# Patient Record
Sex: Female | Born: 1955 | Race: Black or African American | Hispanic: No | Marital: Married | Smoking: Never smoker
Health system: Southern US, Community
[De-identification: ages and names within clinical notes are randomized; demographics above are authoritative.]

## PROBLEM LIST (undated history)

## (undated) DIAGNOSIS — M1990 Unspecified osteoarthritis, unspecified site: Secondary | ICD-10-CM

## (undated) DIAGNOSIS — I10 Essential (primary) hypertension: Secondary | ICD-10-CM

## (undated) HISTORY — PX: COLONOSCOPY: SHX174

## (undated) HISTORY — DX: Unspecified osteoarthritis, unspecified site: M19.90

---

## 2003-10-03 HISTORY — PX: OTHER SURGICAL HISTORY: SHX169

## 2017-05-08 ENCOUNTER — Emergency Department (HOSPITAL_COMMUNITY)
Admission: EM | Admit: 2017-05-08 | Discharge: 2017-05-08 | Disposition: A | Discharge status: Home / Self Care | Payer: BLUE CROSS/BLUE SHIELD | Attending: Emergency Medicine | Admitting: Emergency Medicine

## 2017-05-08 ENCOUNTER — Encounter (HOSPITAL_COMMUNITY): Payer: Self-pay

## 2017-05-08 ENCOUNTER — Emergency Department (HOSPITAL_COMMUNITY): Payer: BLUE CROSS/BLUE SHIELD

## 2017-05-08 DIAGNOSIS — I10 Essential (primary) hypertension: Secondary | ICD-10-CM | POA: Insufficient documentation

## 2017-05-08 DIAGNOSIS — R200 Anesthesia of skin: Secondary | ICD-10-CM | POA: Diagnosis not present

## 2017-05-08 DIAGNOSIS — R072 Precordial pain: Secondary | ICD-10-CM | POA: Diagnosis not present

## 2017-05-08 DIAGNOSIS — M79601 Pain in right arm: Secondary | ICD-10-CM | POA: Insufficient documentation

## 2017-05-08 DIAGNOSIS — R0602 Shortness of breath: Secondary | ICD-10-CM | POA: Diagnosis present

## 2017-05-08 HISTORY — DX: Essential (primary) hypertension: I10

## 2017-05-08 LAB — CBC
HEMATOCRIT: 42.6 % (ref 36.0–46.0)
Hemoglobin: 14.7 g/dL (ref 12.0–15.0)
MCH: 29 pg (ref 26.0–34.0)
MCHC: 34.5 g/dL (ref 30.0–36.0)
MCV: 84 fL (ref 78.0–100.0)
PLATELETS: 250 10*3/uL (ref 150–400)
RBC: 5.07 MIL/uL (ref 3.87–5.11)
RDW: 12.6 % (ref 11.5–15.5)
WBC: 7.7 10*3/uL (ref 4.0–10.5)

## 2017-05-08 LAB — BASIC METABOLIC PANEL
Anion gap: 10 (ref 5–15)
BUN: 13 mg/dL (ref 6–20)
CHLORIDE: 103 mmol/L (ref 101–111)
CO2: 25 mmol/L (ref 22–32)
CREATININE: 0.71 mg/dL (ref 0.44–1.00)
Calcium: 9.7 mg/dL (ref 8.9–10.3)
Glucose, Bld: 130 mg/dL — ABNORMAL HIGH (ref 65–99)
POTASSIUM: 3.6 mmol/L (ref 3.5–5.1)
SODIUM: 138 mmol/L (ref 135–145)

## 2017-05-08 LAB — I-STAT TROPONIN, ED: Troponin i, poc: 0 ng/mL (ref 0.00–0.08)

## 2017-05-08 MED ORDER — ASPIRIN 81 MG PO CHEW
324.0000 mg | CHEWABLE_TABLET | Freq: Once | ORAL | Status: AC
  Administered 2017-05-08: 324 mg via ORAL
  Filled 2017-05-08: qty 4

## 2017-05-08 NOTE — Discharge Instructions (Signed)

## 2017-05-08 NOTE — ED Provider Notes (Signed)
Emergency Department Provider Note   I have reviewed the triage vital signs and the nursing notes.   HISTORY  Chief Complaint Shortness of Breath/arm numbness   HPI Deborah [name] is a 61 y.o. female with past medical history of hypertension presents to the emergency department for evaluation of shortness of breath and right arm and chest pain. Symptoms have been ongoing for several months and worsening over the last month. She describes worsening symptoms with exertion. Sometimes she'll have sudden numbness sensation in the right arm if she is using the arm or if the arm is hanging at the side of her body. No fevers or chills. No history of heart attack or stroke. She also notices having shortness of breath with even minor exertion. She does not smoke cigarettes. She is new to the area and has not established care with a primary care physician.    Past Medical History:  Diagnosis Date  . Hypertension     There are no active problems to display for this patient.   History reviewed. No pertinent surgical history.    Allergies Patient has no known allergies.  No family history on file.  Social History Social History  Substance Use Topics  . Smoking status: Never Smoker  . Smokeless tobacco: Never Used  . Alcohol use No    Review of Systems  Constitutional: No fever/chills Eyes: No visual changes. ENT: No sore throat. Cardiovascular: Positive chest pain. Respiratory: Positive shortness of breath. Gastrointestinal: No abdominal pain.  No nausea, no vomiting.  No diarrhea.  No constipation. Genitourinary: Negative for dysuria. Musculoskeletal: Negative for back pain. Skin: Negative for rash. Neurological: Negative for headaches, focal weakness or numbness.  10-point ROS otherwise negative.  ____________________________________________   PHYSICAL EXAM:  VITAL SIGNS: ED Triage Vitals [05/08/17 1021]  Enc Vitals Group     BP (!) 153/98     Pulse Rate  100     Resp 18     Temp 98.2 F (36.8 C)     Temp Source Oral     SpO2 96 %   Constitutional: Alert and oriented. Well appearing and in no acute distress. Eyes: Conjunctivae are normal.  Head: Atraumatic. Nose: No congestion/rhinnorhea. Mouth/Throat: Mucous membranes are moist.  Neck: No stridor.  Cardiovascular: Normal rate, regular rhythm. Good peripheral circulation. Grossly normal heart sounds.   Respiratory: Normal respiratory effort.  No retractions. Lungs CTAB. Gastrointestinal: Soft and nontender. No distention.  Musculoskeletal: No lower extremity tenderness nor edema. No gross deformities of extremities. Normal ROM of the right shoulder.  Neurologic:  Normal speech and language. No gross focal neurologic deficits are appreciated.  Skin:  Skin is warm, dry and intact. No rash noted.  ____________________________________________   LABS (all labs ordered are listed, but only abnormal results are displayed)  Labs Reviewed  BASIC METABOLIC PANEL - Abnormal; Notable for the following:       Result Value   Glucose, Bld 130 (*)    All other components within normal limits  CBC  I-STAT TROPONIN, ED   ____________________________________________  EKG   EKG Interpretation  Date/Time:  Tuesday May 08 2017 10:21:06 EDT Ventricular Rate:  96 PR Interval:  136 QRS Duration: 66 QT Interval:  314 QTC Calculation: 396 R Axis:   1 Text Interpretation:  Normal sinus rhythm Right atrial enlargement Cannot rule out Anterior infarct , age undetermined Abnormal ECG No STEMI. No old tracing for comparison.  Confirmed by Nanda Quinton [phone]) on 05/08/2017 11:05:10 AM  ____________________________________________  RADIOLOGY  Dg Chest 2 View  Result Date: 05/08/2017 CLINICAL DATA:  Patient complains of 1 month of increasing exertional shortness of breath and right arm numbness. Also complains of right sided chest and axilla pain. EXAM: CHEST  2 VIEW COMPARISON:  None.  FINDINGS: Lateral view degraded by patient arm position. Numerous leads and wires project over the chest. Mild right hemidiaphragm elevation. Midline trachea. Normal heart size and mediastinal contours. Atherosclerosis in the transverse aorta. No pleural effusion or pneumothorax. Clear lungs. IMPRESSION: No acute cardiopulmonary disease. Aortic Atherosclerosis (ICD10-I70.0). Electronically Signed   By: Abigail Miyamoto M.D.   On: 05/08/2017 11:11   Ct Head Wo Contrast  Result Date: 05/08/2017 CLINICAL DATA:  Right arm numbness. EXAM: CT HEAD WITHOUT CONTRAST TECHNIQUE: Contiguous axial images were obtained from the base of the skull through the vertex without intravenous contrast. COMPARISON:  None. FINDINGS: Brain: No evidence of acute infarction, hemorrhage, hydrocephalus, extra-axial collection or mass lesion/mass effect. Vascular: No hyperdense vessel or unexpected calcification. Skull: Normal. Negative for fracture or focal lesion. Sinuses/Orbits: No acute finding. Other: Bony prominence is seen involving the left parietal skull. IMPRESSION: No acute intracranial abnormality seen. Electronically Signed   By: Marijo Conception, M.D.   On: 05/08/2017 12:04    ____________________________________________   PROCEDURES  Procedure(s) performed:   Procedures  None ____________________________________________   INITIAL IMPRESSION / ASSESSMENT AND PLAN / ED COURSE  Pertinent labs & imaging results that were available during my care of the patient were reviewed by me and considered in my medical decision making (see chart for details).  Patient presents to the emergency department for prolonged chest pain, SOB with exertion, and right arm discomfort/numbness. Low suspicion for CVA but with prolonged symptoms will obtain CT head. No active CP or SOB on my evaluation. Symptoms progressing over months. Plan for troponin and CXR.   Labs and imaging unremarkable. With several months of symptoms recommended  PCP follow up with Cardiology and Neurology follow up. No evidence of critical cervical spine stenosis on exam. Suspect peripheral nerve compression. With CP would recommend PCP evaluation and consideration of outpatient stress test.   At this time, I do not feel there is any life-threatening condition present. I have reviewed and discussed all results (EKG, imaging, lab, urine as appropriate), exam findings with patient. I have reviewed nursing notes and appropriate previous records.  I feel the patient is safe to be discharged home without further emergent workup. Discussed usual and customary return precautions. Patient and family (if present) verbalize understanding and are comfortable with this plan.  Patient will follow-up with their primary care provider. If they do not have a primary care provider, information for follow-up has been provided to them. All questions have been answered.  ____________________________________________  FINAL CLINICAL IMPRESSION(S) / ED DIAGNOSES  Final diagnoses:  Right arm pain  Right arm numbness  Precordial chest pain     MEDICATIONS GIVEN DURING THIS VISIT:  Medications  aspirin chewable tablet 324 mg (324 mg Oral Given 05/08/17 1142)     NEW OUTPATIENT MEDICATIONS STARTED DURING THIS VISIT:  There are no discharge medications for this patient.     Note:  This document was prepared using Dragon voice recognition software and may include unintentional dictation errors.  Nanda Quinton, MD Emergency Medicine    Long, [facility], MD 05/08/17 Dorthula Perfect

## 2017-05-08 NOTE — ED Triage Notes (Signed)
Patient complains of 1 month of increasing exertional shortness of breath and right arm numbness. Also complains of right sided chest and axilla pain. Alert and oriented on arrival

## 2017-05-08 NOTE — ED Notes (Signed)
Pt to radiology.

## 2017-05-08 NOTE — ED Notes (Signed)
Pt ambulated to room from waiting room, tolerated well. 

## 2017-05-08 NOTE — ED Notes (Signed)
Patient given discharge instructions and verbalized understanding.  Patient stable to discharge at this time.  Patient is alert and oriented to baseline.  No distressed noted at this time.  All belongings taken with the patient at discharge.   

## 2017-05-10 ENCOUNTER — Encounter: Payer: Self-pay | Admitting: Neurology

## 2017-05-10 ENCOUNTER — Ambulatory Visit (INDEPENDENT_AMBULATORY_CARE_PROVIDER_SITE_OTHER): Payer: BLUE CROSS/BLUE SHIELD | Admitting: Neurology

## 2017-05-10 VITALS — BP 146/93 | HR 98 | Ht 62.0 in | Wt 149.5 lb

## 2017-05-10 DIAGNOSIS — M79601 Pain in right arm: Secondary | ICD-10-CM

## 2017-05-10 DIAGNOSIS — M5412 Radiculopathy, cervical region: Secondary | ICD-10-CM | POA: Diagnosis not present

## 2017-05-10 DIAGNOSIS — R29898 Other symptoms and signs involving the musculoskeletal system: Secondary | ICD-10-CM | POA: Diagnosis not present

## 2017-05-10 DIAGNOSIS — R202 Paresthesia of skin: Secondary | ICD-10-CM

## 2017-05-10 DIAGNOSIS — R200 Anesthesia of skin: Secondary | ICD-10-CM

## 2017-05-10 MED ORDER — METHYLPREDNISOLONE 4 MG PO TBPK
ORAL_TABLET | ORAL | 0 refills | Status: DC
Start: 2017-05-10 — End: 2019-08-27

## 2017-05-10 MED ORDER — ETODOLAC 400 MG PO TABS: 400.0000 mg | ORAL_TABLET | Freq: Two times a day (BID) | ORAL | 5 refills | Status: DC

## 2017-05-10 MED ORDER — GABAPENTIN 300 MG PO CAPS: 300.0000 mg | ORAL_CAPSULE | Freq: Three times a day (TID) | ORAL | 11 refills | Status: DC

## 2017-05-10 NOTE — Patient Instructions (Signed)
I called him 3 prescriptions for you. The methylprednisolone is a steroid that you will take over 6 days.  Take etodolac 400 mg twice a day. This is usually stronger than the ibuprofen.  Take gabapentin 300 mg 3 times a day.

## 2017-05-10 NOTE — Progress Notes (Signed)
[facility]  PATIENT: Deborah [name] DOB: 1956-03-04  REFERRING DOCTOR OR PCP:  ED  SOURCE: patient, ED notes, lab and imaging results, CT scan images on PACS.  _________________________________   HISTORICAL  CHIEF COMPLAINT:  Chief Complaint  Patient presents with  . New Patient (Initial Visit)    Referral from the ED of rigiht arm numbness,    HISTORY OF PRESENT ILLNESS:  Deborah [name] is a 61 year old woman with hypertension who presented to the emergency room on 05/08/2017 with shortness of breath and right arm numbness and pain and chest pain.  She has had pain and numbness in the right arm for 6 months that worsened over the past week and she went to the ED 2 days ago.    She has not had much neck pain.     She notes that the pain will radiate from the shoulder into the hand, involving the third and fourth fingers the most.   Generally, pain is worse when she is standing with the arm by her side or when she lays down with the arm by her side. Having the arm over her chest will usually decrease the pain.   She also notes that there is some weakness in the arm. When the arm gets numb and painful with the weakness seems to be worse.    She does not note any weakness in the legs. There is no leg numbness. Bladder function is unchanged.  In the Emergency room, she had a CT scan of the head. I personally reviewed the images and it is normal. She also had a chest x-ray was read as normal except for aortic atherosclerosis.   She was given aspirin and had lab work. An EKG was performed that showed normal rate, rhythm, and intervals.  Anterior infarct could not be ruled out but there was no evidence of a STEMI.  She was discharged with cardiology in neurology follow-up.  REVIEW OF SYSTEMS: Constitutional: No fevers, chills, sweats, or change in appetite Eyes: No visual changes, double vision, eye pain Ear, nose and throat: No hearing loss, ear pain, nasal  congestion, sore throat Cardiovascular: No chest pain, palpitations Respiratory: No shortness of breath at rest or with exertion.   No wheezes GastrointestinaI: No nausea, vomiting, diarrhea, abdominal pain, fecal incontinence Genitourinary: No dysuria, urinary retention or frequency.  No nocturia. Musculoskeletal: No neck pain, back pain Integumentary: No rash, pruritus, skin lesions Neurological: as above Psychiatric: No depression at this time.  No anxiety Endocrine: No palpitations, diaphoresis, change in appetite, change in weigh or increased thirst Hematologic/Lymphatic: No anemia, purpura, petechiae. Allergic/Immunologic: No itchy/runny eyes, nasal congestion, recent allergic reactions, rashes  ALLERGIES: No Known Allergies  HOME MEDICATIONS:  Current Outpatient Prescriptions:  .  ASPIRIN 81 PO, Take by mouth., Disp: , Rfl:  .  Ibuprofen (ADVIL PO), Take by mouth., Disp: , Rfl:  .  valsartan (DIOVAN) 40 MG tablet, Take 40 mg by mouth daily., Disp: , Rfl:  .  valsartan-hydrochlorothiazide (DIOVAN-HCT) 320-25 MG tablet, Take 1 tablet by mouth daily., Disp: , Rfl:  .  etodolac (LODINE) 400 MG tablet, Take 1 tablet (400 mg total) by mouth 2 (two) times daily., Disp: 60 tablet, Rfl: 5 .  gabapentin (NEURONTIN) 300 MG capsule, Take 1 capsule (300 mg total) by mouth 3 (three) times daily., Disp: 90 capsule, Rfl: 11 .  methylPREDNISolone (MEDROL DOSEPAK) 4 MG TBPK tablet, 6 po x 1 d, then 5 po x 1d, then 4 po x 1d,  then 3 po x 1d, then 2 po x 1d, then 1 po, Disp: 21 tablet, Rfl: 0  PAST MEDICAL HISTORY: Past Medical History:  Diagnosis Date  . Hypertension     PAST SURGICAL HISTORY: History reviewed. No pertinent surgical history.  FAMILY HISTORY: History reviewed. No pertinent family history.  SOCIAL HISTORY:  Social History   Social History  . Marital status: Married    Spouse name: N/A  . Number of children: N/A  . Years of education: N/A   Occupational History    . Not on file.   Social History Main Topics  . Smoking status: Never Smoker  . Smokeless tobacco: Never Used  . Alcohol use No  . Drug use: No  . Sexual activity: Not on file   Other Topics Concern  . Not on file   Social History Narrative  . No narrative on file     PHYSICAL EXAM  Vitals:   05/10/17 1422  BP: (!) 146/93  Pulse: 98  Weight: 149 lb 8 oz (67.8 kg)  Height: 5\' 2"  (1.575 m)    Body mass index is 27.34 kg/m.   General: The patient is well-developed and well-nourished and in no acute distress  Eyes:  Funduscopic exam shows normal optic discs and retinal vessels.  Neck: The neck is supple, no carotid bruits are noted.  The neck is nontender.  Cardiovascular: The heart has a regular rate and rhythm with a normal S1 and S2. There were no murmurs, gallops or rubs. Lungs are clear to auscultation.  Skin: Extremities are without significant edema.  Musculoskeletal:  Back is nontender  Neurologic Exam  Mental status: The patient is alert and oriented x 3 at the time of the examination. The patient has apparent normal recent and remote memory, with an apparently normal attention span and concentration ability.   Speech is normal.  Cranial nerves: Extraocular movements are full. Pupils are equal, round, and reactive to light and accomodation.  Visual fields are full.  Facial symmetry is present. There is good facial sensation to soft touch bilaterally.Facial strength is normal.  Trapezius and sternocleidomastoid strength is normal. No dysarthria is noted.  The tongue is midline, and the patient has symmetric elevation of the soft palate. No obvious hearing deficits are noted.  Motor:  Muscle bulk is normal.   Tone is normal. Strength is 4/5 in the triceps, pronators and finger extensors..   Sensory: Sensory testing is intact to pinprick, soft touch and vibration sensation in all 4 extremities.  Coordination: Cerebellar testing reveals good finger-nose-finger  and heel-to-shin bilaterally.  Gait and station: Station is normal.   Gait is normal. Tandem gait is normal. Romberg is negative.   Reflexes: Deep tendon reflexes are symmetric in the biceps but slightly reduced in the right triceps versus left triceps. Leg DTRs are symmetric and normal bilaterally.   Plantar responses are flexor.    DIAGNOSTIC DATA (LABS, IMAGING, TESTING) - I reviewed patient records, labs, notes, testing and imaging myself where available.  Lab Results  Component Value Date   WBC 7.7 05/08/2017   HGB 14.7 05/08/2017   HCT 42.6 05/08/2017   MCV 84.0 05/08/2017   PLT 250 05/08/2017      Component Value Date/Time   NA 138 05/08/2017 1023   K 3.6 05/08/2017 1023   CL 103 05/08/2017 1023   CO2 25 05/08/2017 1023   GLUCOSE 130 (H) 05/08/2017 1023   BUN 13 05/08/2017 1023   CREATININE 0.71 05/08/2017 1023  CALCIUM 9.7 05/08/2017 1023   GFRNONAA >60 05/08/2017 1023   GFRAA >60 05/08/2017 1023         ASSESSMENT AND PLAN  Cervical radiculopathy at C7 - Plan: MR CERVICAL SPINE WO CONTRAST  Right arm numbness  Right arm pain  Right arm weakness   In summary, Mrs. Inda Merlin is a 61 year old woman with right arm pain and numbness who has weakness on examination and mild reflex asymmetry most consistent with a right C7 radiculopathy. Symptoms have not improved with time or conservative treatment. Additionally, because of the weakness, we need to get an MRI of the cervical spine to determine if there is a herniated disc or other finding explaining the radiculopathy. Depending on the results and whether or not she receives a benefit from med's we will prescribe, she might be a candidate for an epidural steroid injection or referral to neurosurgery.   I will go ahead and have her take a steroid pack and then start etodolac and gabapentin to see if that gives her any more benefit.  She will return to see me in 6-8 weeks or sooner if there are new or worsening  neurologic symptoms.   Richard A. Felecia Shelling, MD, [facility] 5/0/0370, 4:88 PM Certified in Neurology, Clinical Neurophysiology, Sleep Medicine, Pain Medicine and Neuroimaging  [facility] [address], [place], [place] [address] [phone]

## 2017-05-14 ENCOUNTER — Telehealth: Payer: Self-pay | Admitting: Neurology

## 2017-05-14 ENCOUNTER — Encounter: Payer: Self-pay | Admitting: *Deleted

## 2017-05-14 NOTE — Telephone Encounter (Signed)
I have spoken with pt. this am.  She sts. she works as a Scientist, water quality at [employer].  Right arm pain is improved since starting Etodolac, oral steroids, Gabapentin. She would like to continue working, but with restrictions.  Letter printed and up front for her to pick up/fim

## 2017-05-14 NOTE — Telephone Encounter (Signed)
Patient called office in reference to her visit with Dr. Felecia Shelling on 05/10/17.  Patient would like to know if she is able to get a work note to be out of work for 2 weeks due to medications and not being sure what is wrong with her right now.  Please call

## 2017-05-24 ENCOUNTER — Ambulatory Visit
Admission: RE | Admit: 2017-05-24 | Discharge: 2017-05-24 | Disposition: A | Discharge status: Home / Self Care | Payer: BLUE CROSS/BLUE SHIELD | Source: Ambulatory Visit | Attending: Neurology | Admitting: Neurology

## 2017-05-24 DIAGNOSIS — M5412 Radiculopathy, cervical region: Secondary | ICD-10-CM

## 2017-05-29 ENCOUNTER — Telehealth: Payer: Self-pay | Admitting: *Deleted

## 2017-05-29 DIAGNOSIS — R29898 Other symptoms and signs involving the musculoskeletal system: Secondary | ICD-10-CM

## 2017-05-29 DIAGNOSIS — R200 Anesthesia of skin: Secondary | ICD-10-CM

## 2017-05-29 DIAGNOSIS — M79601 Pain in right arm: Secondary | ICD-10-CM

## 2017-05-29 DIAGNOSIS — M5412 Radiculopathy, cervical region: Secondary | ICD-10-CM

## 2017-05-29 NOTE — Telephone Encounter (Signed)
-----   Message from Britt Bottom, MD sent at 05/28/2017  4:25 PM EDT ----- Please note that the MRI of the cervical spine does show degenerative changes at C4-C5, C5-C6 and C6-C7. 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     If she is not any better we can refer for a cervical epidural steroid injection

## 2017-05-29 NOTE — Telephone Encounter (Signed)
I have spoken with Deborah [name] and her husband this afternoon, reviewed MRI results as below, explained esi and offered to her for continued pain.  She verbalized understanding of same, is agreeable with esi. Order in EPIC/fim

## 2017-05-30 NOTE — Telephone Encounter (Signed)
Order was sent  For epidural steroid Injection .

## 2017-06-11 ENCOUNTER — Ambulatory Visit: Payer: Self-pay | Admitting: Internal Medicine

## 2017-06-18 ENCOUNTER — Ambulatory Visit
Admission: RE | Admit: 2017-06-18 | Discharge: 2017-06-18 | Disposition: A | Discharge status: Home / Self Care | Payer: BLUE CROSS/BLUE SHIELD | Source: Ambulatory Visit | Attending: Neurology | Admitting: Neurology

## 2017-06-18 DIAGNOSIS — R29898 Other symptoms and signs involving the musculoskeletal system: Secondary | ICD-10-CM

## 2017-06-18 DIAGNOSIS — R200 Anesthesia of skin: Secondary | ICD-10-CM

## 2017-06-18 DIAGNOSIS — M79601 Pain in right arm: Secondary | ICD-10-CM

## 2017-06-18 DIAGNOSIS — M5412 Radiculopathy, cervical region: Secondary | ICD-10-CM

## 2017-06-18 MED ORDER — TRIAMCINOLONE ACETONIDE 40 MG/ML IJ SUSP (RADIOLOGY)
60.0000 mg | Freq: Once | INTRAMUSCULAR | Status: AC
  Administered 2017-06-18: 60 mg via EPIDURAL

## 2017-06-18 MED ORDER — IOPAMIDOL (ISOVUE-M 300) INJECTION 61%
1.0000 mL | Freq: Once | INTRAMUSCULAR | Status: AC | PRN
  Administered 2017-06-18: 1 mL via EPIDURAL

## 2017-06-18 NOTE — Discharge Instructions (Signed)

## 2017-06-26 ENCOUNTER — Other Ambulatory Visit: Payer: Self-pay | Admitting: Neurology

## 2017-06-26 ENCOUNTER — Encounter: Payer: Self-pay | Admitting: Neurology

## 2017-06-26 ENCOUNTER — Ambulatory Visit (INDEPENDENT_AMBULATORY_CARE_PROVIDER_SITE_OTHER): Payer: BLUE CROSS/BLUE SHIELD | Admitting: Neurology

## 2017-06-26 VITALS — BP 138/94 | HR 75 | Resp 16 | Ht 62.0 in | Wt 157.5 lb

## 2017-06-26 DIAGNOSIS — M4802 Spinal stenosis, cervical region: Secondary | ICD-10-CM

## 2017-06-26 DIAGNOSIS — M5412 Radiculopathy, cervical region: Secondary | ICD-10-CM

## 2017-06-26 DIAGNOSIS — M79601 Pain in right arm: Secondary | ICD-10-CM

## 2017-06-26 DIAGNOSIS — R29898 Other symptoms and signs involving the musculoskeletal system: Secondary | ICD-10-CM | POA: Diagnosis not present

## 2017-06-26 MED ORDER — VALSARTAN 40 MG PO TABS: 40.0000 mg | ORAL_TABLET | Freq: Every day | ORAL | 1 refills | Status: DC

## 2017-06-26 NOTE — Progress Notes (Signed)
[facility]  PATIENT: Deborah [name] DOB: Oct 26, 1955  REFERRING DOCTOR OR PCP:  ED  SOURCE: patient, ED notes, lab and imaging results, CT scan images on PACS.  _________________________________   HISTORICAL  CHIEF COMPLAINT:  Chief Complaint  Patient presents with  . Neck Pain    Sts. neck pain is completely resolved since esi./fim    HISTORY OF PRESENT ILLNESS:  Deborah [name] is a 61 year old woman with a right cervical radiculopathy.   Update 06/26/2017:   Initially, as she had hypertension, when she went to the emergency room she was evaluated for a cardiac event. She was referred to me for the neck and arm pain.   I reviewed the MRI personally in the presence of Brinkley and her husband.     The MRI shows borderline retrolisthesis at C4-C5 social with reduced disc height, disc protrusion and uncovertebral spurring causing moderate spinal stenosis and mild bilateral foraminal narrowing. At C5-C6 there is disc protrusion and uncovertebral spurring causing mild spinal stenosis and mild bilateral foraminal narrowing at C6-C7 there is a small left paramedian disc protrusion but no nerve root compression. She was started on gabapentin and the gabapentin helped her rest better at night but incompletely helped her pain. She was referred for an epidural steroid injection. She underwent this on 06/18/2017 (to the right at C7T1).    Since then, she feels her pain is much better. She now does not experience any pain in the right arm or the neck. She is sleeping even better at night.    She denies any other neurologic symptoms. Specifically there is no numbness or weakness. She does not note any bladder difficulty.    At the initial visit, there was mild weakness in the right triceps and normal sensory exam.  ______________________________________________ From 05/10/2017  She has had pain and numbness in the right arm for 6 months that worsened over the past week and  she went to the ED 2 days ago.    She has not had much neck pain.     She notes that the pain will radiate from the shoulder into the hand, involving the third and fourth fingers the most.   Generally, pain is worse when she is standing with the arm by her side or when she lays down with the arm by her side. Having the arm over her chest will usually decrease the pain.   She also notes that there is some weakness in the arm. When the arm gets numb and painful with the weakness seems to be worse.    She does not note any weakness in the legs. There is no leg numbness. Bladder function is unchanged.  In the Emergency room, she had a CT scan of the head. I personally reviewed the images and it is normal. She also had a chest x-ray was read as normal except for aortic atherosclerosis.   She was given aspirin and had lab work. An EKG was performed that showed normal rate, rhythm, and intervals.  Anterior infarct could not be ruled out but there was no evidence of a STEMI.  She was discharged with cardiology in neurology follow-up.  REVIEW OF SYSTEMS: Constitutional: No fevers, chills, sweats, or change in appetite Eyes: No visual changes, double vision, eye pain Ear, nose and throat: No hearing loss, ear pain, nasal congestion, sore throat Cardiovascular: No chest pain, palpitations Respiratory: No shortness of breath at rest or with exertion.   No wheezes GastrointestinaI: No nausea, vomiting,  diarrhea, abdominal pain, fecal incontinence Genitourinary: No dysuria, urinary retention or frequency.  No nocturia. Musculoskeletal: No neck pain, back pain Integumentary: No rash, pruritus, skin lesions Neurological: as above Psychiatric: No depression at this time.  No anxiety Endocrine: No palpitations, diaphoresis, change in appetite, change in weigh or increased thirst Hematologic/Lymphatic: No anemia, purpura, petechiae. Allergic/Immunologic: No itchy/runny eyes, nasal congestion, recent allergic  reactions, rashes  ALLERGIES: No Known Allergies  HOME MEDICATIONS:  Current Outpatient Prescriptions:  .  ASPIRIN 81 PO, Take by mouth., Disp: , Rfl:  .  etodolac (LODINE) 400 MG tablet, Take 1 tablet (400 mg total) by mouth 2 (two) times daily., Disp: 60 tablet, Rfl: 5 .  gabapentin (NEURONTIN) 300 MG capsule, Take 1 capsule (300 mg total) by mouth 3 (three) times daily., Disp: 90 capsule, Rfl: 11 .  Ibuprofen (ADVIL PO), Take by mouth., Disp: , Rfl:  .  methylPREDNISolone (MEDROL DOSEPAK) 4 MG TBPK tablet, 6 po x 1 d, then 5 po x 1d, then 4 po x 1d, then 3 po x 1d, then 2 po x 1d, then 1 po, Disp: 21 tablet, Rfl: 0 .  valsartan (DIOVAN) 40 MG tablet, Take 1 tablet (40 mg total) by mouth daily., Disp: 30 tablet, Rfl: 1 .  valsartan-hydrochlorothiazide (DIOVAN-HCT) 320-25 MG tablet, Take 1 tablet by mouth daily., Disp: , Rfl:   PAST MEDICAL HISTORY: Past Medical History:  Diagnosis Date  . Hypertension     PAST SURGICAL HISTORY: No past surgical history on file.  FAMILY HISTORY: No family history on file.  SOCIAL HISTORY:  Social History   Social History  . Marital status: Married    Spouse name: N/A  . Number of children: N/A  . Years of education: N/A   Occupational History  . Not on file.   Social History Main Topics  . Smoking status: Never Smoker  . Smokeless tobacco: Never Used  . Alcohol use No  . Drug use: No  . Sexual activity: Not on file   Other Topics Concern  . Not on file   Social History Narrative  . No narrative on file     PHYSICAL EXAM  Vitals:   06/26/17 1527  BP: (!) 138/94  Pulse: 75  Resp: 16  Weight: 157 lb 8 oz (71.4 kg)  Height: 5\' 2"  (1.575 m)    Body mass index is 28.81 kg/m.   General: The patient is well-developed and well-nourished and in no acute distress   Neck: The neck is supple, no carotid bruits are noted.  The neck is nontender.   Neurologic Exam  Mental status: The patient is alert and oriented x 3 at  the time of the examination. The patient has apparent normal recent and remote memory, with an apparently normal attention span and concentration ability.   Speech is normal.  Cranial nerves: Extraocular movements are full. Facial strength and sensation is normal.   No obvious hearing deficits are noted.  Motor:  Muscle bulk is normal.   Tone is normal. Strength is 4/5 in the triceps and 4+/5 in a pronators and finger extensors of the right arm and 5/5 elsewhere.  Sensory: She has intact sensation to touch, temperature and vibration in the arms.   Gait and station: Station is normal.   Gait is normal.    Reflexes: Deep tendon reflexes are normal and symmetric at the biceps. DTRs are 1+ at the right triceps and 2+ at the left triceps. DTRs are normal in the legs  DIAGNOSTIC DATA (LABS, IMAGING, TESTING) - I reviewed patient records, labs, notes, testing and imaging myself where available.  Lab Results  Component Value Date   WBC 7.7 05/08/2017   HGB 14.7 05/08/2017   HCT 42.6 05/08/2017   MCV 84.0 05/08/2017   PLT 250 05/08/2017      Component Value Date/Time   NA 138 05/08/2017 1023   K 3.6 05/08/2017 1023   CL 103 05/08/2017 1023   CO2 25 05/08/2017 1023   GLUCOSE 130 (H) 05/08/2017 1023   BUN 13 05/08/2017 1023   CREATININE 0.71 05/08/2017 1023   CALCIUM 9.7 05/08/2017 1023   GFRNONAA >60 05/08/2017 1023   GFRAA >60 05/08/2017 1023         ASSESSMENT AND PLAN  Right arm weakness  Right arm pain  Cervical radiculopathy  Cervical spinal stenosis   1.    She will continue gabapentin. We discussed that if she continues to do well she can try to taper off of the gabapentin. If she finds that she did better on the medication she could always go back on. Additionally, if her sleep is better because of the gabapentin she can keep the bedtime doses even if she stops the other doses. 2.    We discussed that if her pain returns in a similar characteristic as the  initial pain, we can refer her back for another epidural steroid injection.     3.    They have recently moved to this area and she has been on able to get in with her primary care yet. I will go ahead and write her blood pressure medicine for 30 days with one refill to give her time to establish care with a primary care provider.  4.    She will return as needed if there are new or worsening neurologic symptoms.   Euna Armon A. Felecia Shelling, MD, [facility] [address] 7/82/9562, 1:30 PM Certified in Neurology, Clinical Neurophysiology, Sleep Medicine, Pain Medicine and Neuroimaging  [facility] [address] [place], [place] [address] [phone]

## 2017-06-27 ENCOUNTER — Telehealth: Payer: Self-pay | Admitting: Neurology

## 2017-06-27 NOTE — Telephone Encounter (Signed)
Patient is calling to discuss dosage of valsartan-hydrochlorothiazide (DIOVAN-HCT) 320-25 MG tablet. She also needs a letter stating she is not allowed to lift heavy items at her job.Marland Kitchen Please call patient when ready for pick up for letter.

## 2017-06-27 NOTE — Telephone Encounter (Signed)
Spoke with pt. who sts. that, after checking her med bottle at home, realized she takes Valsartan/HCTZ 320/25mg . New rx. escribed to [facility], and I called [facility] and explained they should disregard Valsartan rx. escribed yesterday. Also explained to pt. she may return to work without restrictions since she is completely pain free.  Should still stop any activity that reproduces pain. She verbalized understanding of same./fim

## 2017-06-27 NOTE — Addendum Note (Signed)
Addended by: France Ravens I on: 06/27/2017 03:17 PM   Modules accepted: Orders

## 2017-07-27 ENCOUNTER — Ambulatory Visit (INDEPENDENT_AMBULATORY_CARE_PROVIDER_SITE_OTHER): Payer: BLUE CROSS/BLUE SHIELD | Admitting: Internal Medicine

## 2017-07-27 ENCOUNTER — Encounter: Payer: Self-pay | Admitting: Internal Medicine

## 2017-07-27 DIAGNOSIS — I10 Essential (primary) hypertension: Secondary | ICD-10-CM

## 2017-07-27 DIAGNOSIS — Z23 Encounter for immunization: Secondary | ICD-10-CM

## 2017-07-27 MED ORDER — VALSARTAN-HYDROCHLOROTHIAZIDE 320-25 MG PO TABS: 1.0000 | ORAL_TABLET | Freq: Every day | ORAL | 3 refills | Status: DC

## 2017-07-27 NOTE — Patient Instructions (Addendum)
I have filled your prescription. You will need to follow up for health maintenance in 1 week.   Back Exercises If you have pain in your back, do these exercises 2-3 times each day or as told by your doctor. When the pain goes away, do the exercises once each day, but repeat the steps more times for each exercise (do more repetitions). If you do not have pain in your back, do these exercises once each day or as told by your doctor. Exercises Single Knee to Chest  Do these steps 3-5 times in a row for each leg: 1. Lie on your back on a firm bed or the floor with your legs stretched out. 2. Bring one knee to your chest. 3. Hold your knee to your chest by grabbing your knee or thigh. 4. Pull on your knee until you feel a gentle stretch in your lower back. 5. Keep doing the stretch for 10-30 seconds. 6. Slowly let go of your leg and straighten it.  Pelvic Tilt  Do these steps 5-10 times in a row: 1. Lie on your back on a firm bed or the floor with your legs stretched out. 2. Bend your knees so they point up to the ceiling. Your feet should be flat on the floor. 3. Tighten your lower belly (abdomen) muscles to press your lower back against the floor. This will make your tailbone point up to the ceiling instead of pointing down to your feet or the floor. 4. Stay in this position for 5-10 seconds while you gently tighten your muscles and breathe evenly.  Cat-Cow  Do these steps until your lower back bends more easily: 1. Get on your hands and knees on a firm surface. Keep your hands under your shoulders, and keep your knees under your hips. You may put padding under your knees. 2. Let your head hang down, and make your tailbone point down to the floor so your lower back is round like the back of a cat. 3. Stay in this position for 5 seconds. 4. Slowly lift your head and make your tailbone point up to the ceiling so your back hangs low (sags) like the back of a cow. 5. Stay in this position for  5 seconds.  Press-Ups  Do these steps 5-10 times in a row: 1. Lie on your belly (face-down) on the floor. 2. Place your hands near your head, about shoulder-width apart. 3. While you keep your back relaxed and keep your hips on the floor, slowly straighten your arms to raise the top half of your body and lift your shoulders. Do not use your back muscles. To make yourself more comfortable, you may change where you place your hands. 4. Stay in this position for 5 seconds. 5. Slowly return to lying flat on the floor.  Bridges  Do these steps 10 times in a row: 1. Lie on your back on a firm surface. 2. Bend your knees so they point up to the ceiling. Your feet should be flat on the floor. 3. Tighten your butt muscles and lift your butt off of the floor until your waist is almost as high as your knees. If you do not feel the muscles working in your butt and the back of your thighs, slide your feet 1-2 inches farther away from your butt. 4. Stay in this position for 3-5 seconds. 5. Slowly lower your butt to the floor, and let your butt muscles relax.  If this exercise is too easy,  try doing it with your arms crossed over your chest. Belly Crunches  Do these steps 5-10 times in a row: 1. Lie on your back on a firm bed or the floor with your legs stretched out. 2. Bend your knees so they point up to the ceiling. Your feet should be flat on the floor. 3. Cross your arms over your chest. 4. Tip your chin a little bit toward your chest but do not bend your neck. 5. Tighten your belly muscles and slowly raise your chest just enough to lift your shoulder blades a tiny bit off of the floor. 6. Slowly lower your chest and your head to the floor.  Back Lifts Do these steps 5-10 times in a row: 1. Lie on your belly (face-down) with your arms at your sides, and rest your forehead on the floor. 2. Tighten the muscles in your legs and your butt. 3. Slowly lift your chest off of the floor while you  keep your hips on the floor. Keep the back of your head in line with the curve in your back. Look at the floor while you do this. 4. Stay in this position for 3-5 seconds. 5. Slowly lower your chest and your face to the floor.  Contact a doctor if:  Your back pain gets a lot worse when you do an exercise.  Your back pain does not lessen 2 hours after you exercise. If you have any of these problems, stop doing the exercises. Do not do them again unless your doctor says it is okay. Get help right away if:  You have sudden, very bad back pain. If this happens, stop doing the exercises. Do not do them again unless your doctor says it is okay. This information is not intended to replace advice given to you by your health care provider. Make sure you discuss any questions you have with your health care provider. Document Released: 10/21/2010 Document Revised: 02/24/2016 Document Reviewed: 11/12/2014 Elsevier Interactive Patient Education  [employer].

## 2017-07-27 NOTE — Progress Notes (Signed)
   [name] Kerrin Mo, MD Phone: [phone]  Reason For Visit: Establish Care   # CHRONIC HTN Current Meds - Diovan, HCTZ   Reports good compliance, took meds today. Tolerating well, w/o complaints. Has been having elevated blood pressures  Lifestyle - Difficulty with exercise due to back and neck pain  Denies CP, dyspnea, HA, edema, dizziness / lightheadedness  Past Medical History Reviewed problem list.  Medications- reviewed and updated No additions to family history Social history- patient is a  Non- smoker  Objective: BP (!) 138/96   Pulse 77   Temp 98.6 F (37 C) (Oral)   Ht 5\' 2"  (1.575 m)   Wt 157 lb (71.2 kg)   SpO2 99%   BMI 28.72 kg/m  Gen: NAD, alert, cooperative with exam HEENT: Normal    Neck: No masses palpated. No lymphadenopathy    Ears: Tympanic membranes intact, normal light reflex, no erythema, no bulging    Eyes: PERRLA, EOMI    Nose: nasal turbinates moist    Throat: moist mucus membranes, no erythema Cardio: regular rate and rhythm, S1S2 heard, no murmurs appreciated Pulm: clear to auscultation bilaterally, no wheezes, rhonchi or rales GI: soft, non-tender, non-distended, bowel sounds present, no hepatomegaly, no splenomegaly Extremities: warm, well perfused, No edema, cyanosis or clubbing;  MSK: Normal gait and station Skin: dry, intact, no rashes or lesions Neuro: Strength and sensation grossly intact  Patient reports no  vision/ hearing changes,anorexia, weight change, fever ,adenopathy, persistant / recurrent hoarseness, swallowing issues, chest pain, edema,persistant / recurrent cough, hemoptysis, dyspnea(rest, exertional, paroxysmal nocturnal), gastrointestinal  bleeding (melena, rectal bleeding), abdominal pain, excessive heart burn, GU symptoms(dysuria, hematuria, pyuria, voiding/incontinence  Issues) syncope, focal weakness, severe memory loss, concerning skin lesions, depression, anxiety, abnormal  bruising/bleeding, major joint swelling, breast masses or abnormal vaginal bleeding.    Assessment/Plan: See problem based a/p  Essential hypertension Blood pressure controlled - Follow up in two weeks  - valsartan-hydrochlorothiazide (DIOVAN-HCT) 320-25 MG tablet; Take 1 tablet by mouth daily.  Dispense: 30 tablet; Refill: 3 - CBC - Basic metabolic panel - Lipid panel

## 2017-07-28 LAB — BASIC METABOLIC PANEL
BUN / CREAT RATIO: 20 (ref 12–28)
BUN: 16 mg/dL (ref 8–27)
CO2: 26 mmol/L (ref 20–29)
CREATININE: 0.81 mg/dL (ref 0.57–1.00)
Calcium: 9.4 mg/dL (ref 8.7–10.3)
Chloride: 100 mmol/L (ref 96–106)
GFR calc Af Amer: 90 mL/min/{1.73_m2} (ref >59)
GFR calc non Af Amer: 78 mL/min/{1.73_m2} (ref >59)
GLUCOSE: 101 mg/dL — AB (ref 65–99)
POTASSIUM: 4.2 mmol/L (ref 3.5–5.2)
Sodium: 139 mmol/L (ref 134–144)

## 2017-07-28 LAB — CBC
HEMOGLOBIN: 14.1 g/dL (ref 11.1–15.9)
Hematocrit: 42.2 % (ref 34.0–46.6)
MCH: 28.9 pg (ref 26.6–33.0)
MCHC: 33.4 g/dL (ref 31.5–35.7)
MCV: 87 fL (ref 79–97)
PLATELETS: 289 10*3/uL (ref 150–379)
RBC: 4.88 x10E6/uL (ref 3.77–5.28)
RDW: 13.9 % (ref 12.3–15.4)
WBC: 8 10*3/uL (ref 3.4–10.8)

## 2017-07-28 LAB — LIPID PANEL
CHOLESTEROL TOTAL: 222 mg/dL — AB (ref 100–199)
Chol/HDL Ratio: 5.6 ratio — ABNORMAL HIGH (ref 0.0–4.4)
HDL: 40 mg/dL (ref >39)
LDL CALC: 138 mg/dL — AB (ref 0–99)
TRIGLYCERIDES: 222 mg/dL — AB (ref 0–149)
VLDL Cholesterol Cal: 44 mg/dL — ABNORMAL HIGH (ref 5–40)

## 2017-07-31 NOTE — Assessment & Plan Note (Signed)
Blood pressure controlled - Follow up in two weeks  - valsartan-hydrochlorothiazide (DIOVAN-HCT) 320-25 MG tablet; Take 1 tablet by mouth daily.  Dispense: 30 tablet; Refill: 3 - CBC - Basic metabolic panel - Lipid panel

## 2017-08-01 ENCOUNTER — Encounter: Payer: Self-pay | Admitting: Internal Medicine

## 2017-08-17 ENCOUNTER — Ambulatory Visit (INDEPENDENT_AMBULATORY_CARE_PROVIDER_SITE_OTHER): Payer: BLUE CROSS/BLUE SHIELD | Admitting: Internal Medicine

## 2017-08-17 ENCOUNTER — Other Ambulatory Visit: Payer: Self-pay

## 2017-08-17 ENCOUNTER — Encounter: Payer: Self-pay | Admitting: Internal Medicine

## 2017-08-17 DIAGNOSIS — M5416 Radiculopathy, lumbar region: Secondary | ICD-10-CM

## 2017-08-17 DIAGNOSIS — Z0000 Encounter for general adult medical examination without abnormal findings: Secondary | ICD-10-CM | POA: Diagnosis not present

## 2017-08-17 DIAGNOSIS — Z1231 Encounter for screening mammogram for malignant neoplasm of breast: Secondary | ICD-10-CM

## 2017-08-17 DIAGNOSIS — E781 Pure hyperglyceridemia: Secondary | ICD-10-CM

## 2017-08-17 DIAGNOSIS — Z1239 Encounter for other screening for malignant neoplasm of breast: Secondary | ICD-10-CM

## 2017-08-17 MED ORDER — GABAPENTIN 300 MG PO CAPS: 300.0000 mg | ORAL_CAPSULE | Freq: Three times a day (TID) | ORAL | 11 refills | Status: DC

## 2017-08-17 MED ORDER — PREDNISONE 20 MG PO TABS: 20.0000 mg | ORAL_TABLET | Freq: Every day | ORAL | 0 refills | Status: DC

## 2017-08-17 NOTE — Progress Notes (Signed)
   [name] Deborah Mo, MD Phone: [phone]  Reason For Visit: Follow-up  #Health maintenance -Has had a colonoscopy about 10 years ago -Has not had a mammogram recently in the past couple of years -Patient needs a Pap smear has not had one recently as well. she is not sure when she had her last  #HYPERLIPIDEMIA Disease Monitoring: Recently with elevated triglycerides.  Patient has had this in the past.  She would like to try to modify her diet.   #Lumbar radiculopathy -Patient states she is having right lower back pain this is been going on for about 3-4 months. -She previously had cervical radiculopathy and has been seen by neurology for this patient has received steroid shots which helped in the past -Patient denies any specific trauma -She states the pain starts in her buttocks and goes down into her foot, she note numbness associated -She denies any weakness in her lower extremities -Patient denies any bowel or bladder incontinence any fevers, any nighttime awakenings with pain, any weight loss, patient has not been taking steroids chronically, she is not an IV drug user, she is not had a history of cancer, she is not had a history of immunosuppression,  Past Medical History Reviewed problem list.  Medications- reviewed and updated No additions to family history Social history- patient is a non-smoker  Objective: BP 132/82 (BP Location: Left Arm, Patient Position: Sitting)   Pulse (!) 114   Temp 97.9 F (36.6 C) (Oral)   Ht 5\' 2"  (1.575 m)   Wt 156 lb 3.2 oz (70.9 kg)   SpO2 98%   BMI 28.57 kg/m  Gen: NAD, alert, cooperative with exam Cardio: regular rate and rhythm, S1S2 heard, no murmurs appreciated Pulm: clear to auscultation bilaterally, no wheezes, rhonchi or rale MSK: Patient denies any spinal tenderness, she has no abnormalities on inspection, she indicates may be some slight sacroiliac tenderness, she denies any pain or numbness  with straight leg test, 5 out of 5 strength in her lower extremities, 2+ DP pulses, normal gait and station Skin: dry, intact, no rashes or lesions    Assessment/Plan: See problem based a/p  Lumbar radiculopathy Benign examination no red flags, history of cervical radiculopathy likely with lumbar radiculopathy -Discussed follow-up with neurology patient states she has a lot of bills and would like to try to stave that off -Will provide patient with a steroid burst, exercises and gabapentin -Follow-up in 1 month if no improvement  Hypertriglyceridemia Discussed with patient that she may benefit from a statin However patient would like to continue to modify her diet  Healthcare maintenance Provided information on colonoscopy Provided information on mammogram, will place order for mammogram Follow-up for Pap smear

## 2017-08-17 NOTE — Patient Instructions (Addendum)
It was nice seeing you today.  Please follow-up for your colonoscopy and mammogram.  You can make an appointment at my office to specifically get your Pap smear.  Please work on your diet in order to get your triglycerides down some more, we will hold off on starting you on a cholesterol medication.  I am going to prescribe you 5 days of prednisone to help with your radicular pain from your back hopefully this will decrease inflammation. I would continue using the gabapentin as needed    Back Exercises If you have pain in your back, do these exercises 2-3 times each day or as told by your doctor. When the pain goes away, do the exercises once each day, but repeat the steps more times for each exercise (do more repetitions). If you do not have pain in your back, do these exercises once each day or as told by your doctor. Exercises Single Knee to Chest  Do these steps 3-5 times in a row for each leg: 1. Lie on your back on a firm bed or the floor with your legs stretched out. 2. Bring one knee to your chest. 3. Hold your knee to your chest by grabbing your knee or thigh. 4. Pull on your knee until you feel a gentle stretch in your lower back. 5. Keep doing the stretch for 10-30 seconds. 6. Slowly let go of your leg and straighten it.  Pelvic Tilt  Do these steps 5-10 times in a row: 1. Lie on your back on a firm bed or the floor with your legs stretched out. 2. Bend your knees so they point up to the ceiling. Your feet should be flat on the floor. 3. Tighten your lower belly (abdomen) muscles to press your lower back against the floor. This will make your tailbone point up to the ceiling instead of pointing down to your feet or the floor. 4. Stay in this position for 5-10 seconds while you gently tighten your muscles and breathe evenly.  Cat-Cow  Do these steps until your lower back bends more easily: 1. Get on your hands and knees on a firm surface. Keep your hands under your shoulders, and  keep your knees under your hips. You may put padding under your knees. 2. Let your head hang down, and make your tailbone point down to the floor so your lower back is round like the back of a cat. 3. Stay in this position for 5 seconds. 4. Slowly lift your head and make your tailbone point up to the ceiling so your back hangs low (sags) like the back of a cow. 5. Stay in this position for 5 seconds.  Press-Ups  Do these steps 5-10 times in a row: 1. Lie on your belly (face-down) on the floor. 2. Place your hands near your head, about shoulder-width apart. 3. While you keep your back relaxed and keep your hips on the floor, slowly straighten your arms to raise the top half of your body and lift your shoulders. Do not use your back muscles. To make yourself more comfortable, you may change where you place your hands. 4. Stay in this position for 5 seconds. 5. Slowly return to lying flat on the floor.  Bridges  Do these steps 10 times in a row: 1. Lie on your back on a firm surface. 2. Bend your knees so they point up to the ceiling. Your feet should be flat on the floor. 3. Tighten your butt muscles and lift  your butt off of the floor until your waist is almost as high as your knees. If you do not feel the muscles working in your butt and the back of your thighs, slide your feet 1-2 inches farther away from your butt. 4. Stay in this position for 3-5 seconds. 5. Slowly lower your butt to the floor, and let your butt muscles relax.  If this exercise is too easy, try doing it with your arms crossed over your chest. Belly Crunches  Do these steps 5-10 times in a row: 1. Lie on your back on a firm bed or the floor with your legs stretched out. 2. Bend your knees so they point up to the ceiling. Your feet should be flat on the floor. 3. Cross your arms over your chest. 4. Tip your chin a little bit toward your chest but do not bend your neck. 5. Tighten your belly muscles and slowly raise  your chest just enough to lift your shoulder blades a tiny bit off of the floor. 6. Slowly lower your chest and your head to the floor.  Back Lifts Do these steps 5-10 times in a row: 1. Lie on your belly (face-down) with your arms at your sides, and rest your forehead on the floor. 2. Tighten the muscles in your legs and your butt. 3. Slowly lift your chest off of the floor while you keep your hips on the floor. Keep the back of your head in line with the curve in your back. Look at the floor while you do this. 4. Stay in this position for 3-5 seconds. 5. Slowly lower your chest and your face to the floor.  Contact a doctor if:  Your back pain gets a lot worse when you do an exercise.  Your back pain does not lessen 2 hours after you exercise. If you have any of these problems, stop doing the exercises. Do not do them again unless your doctor says it is okay. Get help right away if:  You have sudden, very bad back pain. If this happens, stop doing the exercises. Do not do them again unless your doctor says it is okay. This information is not intended to replace advice given to you by your health care provider. Make sure you discuss any questions you have with your health care provider. Document Released: 10/21/2010 Document Revised: 02/24/2016 Document Reviewed: 11/12/2014 Elsevier Interactive Patient Education  [employer].

## 2017-08-20 DIAGNOSIS — Z0000 Encounter for general adult medical examination without abnormal findings: Secondary | ICD-10-CM | POA: Insufficient documentation

## 2017-08-20 DIAGNOSIS — Z1239 Encounter for other screening for malignant neoplasm of breast: Secondary | ICD-10-CM | POA: Insufficient documentation

## 2017-08-20 DIAGNOSIS — E781 Pure hyperglyceridemia: Secondary | ICD-10-CM | POA: Insufficient documentation

## 2017-08-20 DIAGNOSIS — E785 Hyperlipidemia, unspecified: Secondary | ICD-10-CM | POA: Insufficient documentation

## 2017-08-20 NOTE — Assessment & Plan Note (Signed)
Discussed with patient that she may benefit from a statin However patient would like to continue to modify her diet

## 2017-08-20 NOTE — Assessment & Plan Note (Signed)
Benign examination no red flags, history of cervical radiculopathy likely with lumbar radiculopathy -Discussed follow-up with neurology patient states she has a lot of bills and would like to try to stave that off -Will provide patient with a steroid burst, exercises and gabapentin -Follow-up in 1 month if no improvement

## 2017-08-20 NOTE — Assessment & Plan Note (Signed)
Provided information on colonoscopy Provided information on mammogram, will place order for mammogram Follow-up for Pap smear

## 2017-08-27 ENCOUNTER — Telehealth: Payer: Self-pay | Admitting: Neurology

## 2017-08-27 NOTE — Telephone Encounter (Signed)
Patient says she has had neck pain for the last month. Can another injection be ordered?

## 2017-08-28 NOTE — Telephone Encounter (Signed)
I scheduled patient with Dr. Felecia Shelling on 08-30-17 @ 11am.

## 2017-08-28 NOTE — Telephone Encounter (Signed)
noted/fim 

## 2017-08-30 ENCOUNTER — Other Ambulatory Visit: Payer: Self-pay

## 2017-08-30 ENCOUNTER — Encounter: Payer: Self-pay | Admitting: Neurology

## 2017-08-30 ENCOUNTER — Ambulatory Visit (INDEPENDENT_AMBULATORY_CARE_PROVIDER_SITE_OTHER): Payer: BLUE CROSS/BLUE SHIELD | Admitting: Neurology

## 2017-08-30 VITALS — BP 129/82 | HR 96 | Resp 16 | Ht 62.0 in | Wt 157.5 lb

## 2017-08-30 DIAGNOSIS — M5416 Radiculopathy, lumbar region: Secondary | ICD-10-CM | POA: Diagnosis not present

## 2017-08-30 DIAGNOSIS — R0683 Snoring: Secondary | ICD-10-CM

## 2017-08-30 DIAGNOSIS — M4802 Spinal stenosis, cervical region: Secondary | ICD-10-CM | POA: Diagnosis not present

## 2017-08-30 DIAGNOSIS — M5412 Radiculopathy, cervical region: Secondary | ICD-10-CM | POA: Diagnosis not present

## 2017-08-30 DIAGNOSIS — R5383 Other fatigue: Secondary | ICD-10-CM | POA: Diagnosis not present

## 2017-08-30 NOTE — Progress Notes (Signed)
[facility]  PATIENT: Deborah [name] DOB: 03/02/1956  REFERRING DOCTOR OR PCP:  ED  SOURCE: patient, ED notes, lab and imaging results, CT scan images on PACS.  _________________________________   HISTORICAL  CHIEF COMPLAINT:  Chief Complaint  Patient presents with  . Right Arm Pain    More right arm pain and weakness for the last 1-2 mos.  Improved with oral steroids given by pcp.  Would like to discuss having another esi.  Also c/o lbp, radiating down outer right leg to mid-calf for 4-5 mos.  No known injury.   . Extremity Weakness  . Back Pain    HISTORY OF PRESENT ILLNESS:  Deborah [name] is a 61 year old woman with a right cervical radiculopathy.   Update 08/30/2017:  Her neck pain and right arm pain improved after an epidural steroid injection but has since returned and she is wondering about another epidural. MRI of the cervical spine showed disc protrusion and mild to moderate spinal stenosis at C4-C5 and milder degenerative changes at C5-C6 and C6-C7. There is no definite nerve root compression.  She also reports low back pain that radiates down the outer right leg to the midcalf. Pain is achy and worse with walking.   When pain goes down the leg, it goes down the lateral upper leg to just below the knee where it is more anterolateral.. Pain does not enter the foot. Sitting reduces her pain and she leans forward with more benefit. This has been going on for the past 4-5 months.    She notes that she is limping some.    A prednisone pack has mildly helped.   She has tried walking as exercise but can't walk far without pain worsening.   She notes leaning forward more with walking.    She is feeling more tired and sleepy.   She denies depression.   She snores and has woken herself up gasping a few times.   This seems better since the [facility] with improved sleep.    She will nap after nap and sometimes falls asleep in the evening.  EPWORTH SLEEPINESS  SCALE  On a scale of 0 - 3 what is the chance of dozing:  Sitting and Reading:   0 Watching TV:    0 Sitting inactive in a public place: 0 Passenger in car for one hour: 3 Lying down to rest in the afternoon: 3 Sitting and talking to someone: 0 Sitting quietly after lunch:  2 In a car, stopped in traffic:  0  Total (out of 24):  8/24   Update 06/26/2017:   Initially, as she had hypertension, when she went to the emergency room she was evaluated for a cardiac event. She was referred to me for the neck and arm pain.   I reviewed the MRI personally in the presence of [place] and her husband.     The MRI shows borderline retrolisthesis at C4-C5 associated with reduced disc height, disc protrusion and uncovertebral spurring causing moderate spinal stenosis and mild bilateral foraminal narrowing. At C5-C6 there is disc protrusion and uncovertebral spurring causing mild spinal stenosis and mild bilateral foraminal narrowing at C6-C7 there is a small left paramedian disc protrusion but no nerve root compression. She was started on gabapentin and the gabapentin helped her rest better at night but incompletely helped her pain. She was referred for an epidural steroid injection. She underwent this on 06/18/2017 (to the right at C7T1).    Since then, she feels her  pain is much better. She now does not experience any pain in the right arm or the neck. She is sleeping even better at night.    She denies any other neurologic symptoms. Specifically there is no numbness or weakness. She does not note any bladder difficulty.    At the initial visit, there was mild weakness in the right triceps and normal sensory exam.  ______________________________________________ From 05/10/2017  She has had pain and numbness in the right arm for 6 months that worsened over the past week and she went to the ED 2 days ago.    She has not had much neck pain.     She notes that the pain will radiate from the shoulder into the  hand, involving the third and fourth fingers the most.   Generally, pain is worse when she is standing with the arm by her side or when she lays down with the arm by her side. Having the arm over her chest will usually decrease the pain.   She also notes that there is some weakness in the arm. When the arm gets numb and painful with the weakness seems to be worse.    She does not note any weakness in the legs. There is no leg numbness. Bladder function is unchanged.  In the Emergency room, she had a CT scan of the head. I personally reviewed the images and it is normal. She also had a chest x-ray was read as normal except for aortic atherosclerosis.   She was given aspirin and had lab work. An EKG was performed that showed normal rate, rhythm, and intervals.  Anterior infarct could not be ruled out but there was no evidence of a STEMI.  She was discharged with cardiology in neurology follow-up.  REVIEW OF SYSTEMS: Constitutional: No fevers, chills, sweats, or change in appetite Eyes: No visual changes, double vision, eye pain Ear, nose and throat: No hearing loss, ear pain, nasal congestion, sore throat Cardiovascular: No chest pain, palpitations Respiratory: No shortness of breath at rest or with exertion.   No wheezes GastrointestinaI: No nausea, vomiting, diarrhea, abdominal pain, fecal incontinence Genitourinary: No dysuria, urinary retention or frequency.  No nocturia. Musculoskeletal: No neck pain, back pain Integumentary: No rash, pruritus, skin lesions Neurological: as above Psychiatric: No depression at this time.  No anxiety Endocrine: No palpitations, diaphoresis, change in appetite, change in weigh or increased thirst Hematologic/Lymphatic: No anemia, purpura, petechiae. Allergic/Immunologic: No itchy/runny eyes, nasal congestion, recent allergic reactions, rashes  ALLERGIES: No Known Allergies  HOME MEDICATIONS:  Current Outpatient Medications:  .  ASPIRIN 81 PO, Take by  mouth., Disp: , Rfl:  .  etodolac (LODINE) 400 MG tablet, Take 1 tablet (400 mg total) by mouth 2 (two) times daily., Disp: 60 tablet, Rfl: 5 .  Ibuprofen (ADVIL PO), Take by mouth., Disp: , Rfl:  .  valsartan-hydrochlorothiazide (DIOVAN-HCT) 320-25 MG tablet, Take 1 tablet by mouth daily., Disp: 30 tablet, Rfl: 3 .  gabapentin (NEURONTIN) 300 MG capsule, Take 1 capsule (300 mg total) 3 (three) times daily by mouth. (Patient not taking: Reported on 08/30/2017), Disp: 90 capsule, Rfl: 11 .  methylPREDNISolone (MEDROL DOSEPAK) 4 MG TBPK tablet, 6 po x 1 d, then 5 po x 1d, then 4 po x 1d, then 3 po x 1d, then 2 po x 1d, then 1 po, Disp: 21 tablet, Rfl: 0 .  predniSONE (DELTASONE) 20 MG tablet, Take 1 tablet (20 mg total) daily with breakfast by mouth., Disp: 5  tablet, Rfl: 0  PAST MEDICAL HISTORY: Past Medical History:  Diagnosis Date  . Hypertension     PAST SURGICAL HISTORY: History reviewed. No pertinent surgical history.  FAMILY HISTORY: Family History  Problem Relation Age of Onset  . Ovarian cancer Mother   . Stroke Father   . Breast cancer Sister   . Stroke Brother     SOCIAL HISTORY:  Social History   Socioeconomic History  . Marital status: Married    Spouse name: Not on file  . Number of children: Not on file  . Years of education: Not on file  . Highest education level: Not on file  Social Needs  . Financial resource strain: Not on file  . Food insecurity - worry: Not on file  . Food insecurity - inability: Not on file  . Transportation needs - medical: Not on file  . Transportation needs - non-medical: Not on file  Occupational History  . Occupation: [employer]   Tobacco Use  . Smoking status: Never Smoker  . Smokeless tobacco: Never Used  Substance and Sexual Activity  . Alcohol use: No  . Drug use: No  . Sexual activity: Yes    Partners: Male    Comment: Husband   Other Topics Concern  . Not on file  Social History Narrative   Married   Patient  Use  to work in [place] - 24 (daughter)and 30 years (son)   Patient helps with granddaughter      PHYSICAL EXAM  Vitals:   08/30/17 1047  BP: 129/82  Pulse: 96  Resp: 16  Weight: 157 lb 8 oz (71.4 kg)  Height: 5\' 2"  (1.575 m)    Body mass index is 28.81 kg/m.   General: The patient is well-developed and well-nourished and in no acute distress   Neck: The neck is supple, no carotid bruits are noted.  The neck is nontender.   Neurologic Exam  Mental status: The patient is alert and oriented x 3 at the time of the examination. The patient has apparent normal recent and remote memory, with an apparently normal attention span and concentration ability.   Speech is normal.  Cranial nerves: Extraocular movements are full. Facial strength and sensation is normal.   No obvious hearing deficits are noted.  Motor:  Muscle bulk is normal.   Tone is normal. Strength is 4+/5 in the triceps and hip flexors and 5/5 elsewhere.  Sensory: She has intact sensation to touch, temperature and vibration in the arms.   Gait and station: Station is normal.   Gait is normal.    Reflexes: Deep tendon reflexes are normal and symmetric at the biceps. DTRs are 1+ at the right triceps and 2+ at the left triceps. DTRs are trace in  the legs    DIAGNOSTIC DATA (LABS, IMAGING, TESTING) - I reviewed patient records, labs, notes, testing and imaging myself where available.  Lab Results  Component Value Date   WBC 8.0 07/27/2017   HGB 14.1 07/27/2017   HCT 42.2 07/27/2017   MCV 87 07/27/2017   PLT 289 07/27/2017      Component Value Date/Time   NA 139 07/27/2017 1422   K 4.2 07/27/2017 1422   CL 100 07/27/2017 1422   CO2 26 07/27/2017 1422   GLUCOSE 101 (H) 07/27/2017 1422   GLUCOSE 130 (H) 05/08/2017 1023   BUN 16 07/27/2017 1422   CREATININE 0.81 07/27/2017 1422   CALCIUM 9.4 07/27/2017 1422  GFRNONAA 78 07/27/2017 1422   GFRAA 90 07/27/2017 1422         ASSESSMENT AND  PLAN  Cervical radiculopathy - Plan: Ambulatory request for epidural steroid injection  Cervical spinal stenosis  Lumbar radiculopathy - Plan: MR LUMBAR SPINE WO CONTRAST  Other fatigue  Snoring   1.    Her neck and right arm pain improved after the epidural steroid injection but pain has come back over the past few weeks. I will have her get another epidural steroid injection. We discussed that one does not give any lasting benefit that we may want to consider a referral to a neurosurgeon. She will continue gabapentin.  2.    She has back pain and pain that radiates down the right leg. Characteristics of pain could be due to spinal stenosis and she also could have a right L4 radiculopathy. Check an MRI of the lumbar spine and refer for either ESI or surgery if pain does not improve     3.    He has more fatigue. She snores and occasionally will wake herself up gasping which is consistent with obstructive sleep apnea. However she feels that she is doing better. She does not have excessive daytime sleepiness. I'll hold off on a sleep study as she does not have excessive daytime sleepiness but reconsider if symptoms worsen.  4.    She will return in 4 months or as needed if there are new or worsening neurologic symptoms.    A. Felecia Shelling, MD, [facility] 97/74/1423, 95:32 AM Certified in Neurology, Clinical Neurophysiology, Sleep Medicine, Pain Medicine and Neuroimaging  [facility] [address] [place]  [address] [phone]

## 2017-09-03 ENCOUNTER — Other Ambulatory Visit: Payer: Self-pay | Admitting: Neurology

## 2017-09-03 DIAGNOSIS — M5412 Radiculopathy, cervical region: Secondary | ICD-10-CM

## 2017-09-11 ENCOUNTER — Inpatient Hospital Stay: Admission: RE | Admit: 2017-09-11 | Payer: BLUE CROSS/BLUE SHIELD | Source: Ambulatory Visit

## 2017-09-11 ENCOUNTER — Ambulatory Visit
Admission: RE | Admit: 2017-09-11 | Discharge: 2017-09-11 | Disposition: A | Discharge status: Home / Self Care | Payer: BLUE CROSS/BLUE SHIELD | Source: Ambulatory Visit | Attending: Neurology | Admitting: Neurology

## 2017-09-11 DIAGNOSIS — M5416 Radiculopathy, lumbar region: Secondary | ICD-10-CM

## 2017-09-17 ENCOUNTER — Other Ambulatory Visit: Payer: BLUE CROSS/BLUE SHIELD

## 2017-09-17 ENCOUNTER — Telehealth: Payer: Self-pay | Admitting: *Deleted

## 2017-09-17 NOTE — Telephone Encounter (Signed)
-----   Message from Britt Bottom, MD sent at 09/16/2017 11:50 AM EST ----- Please let her know that the MRI of the lumbar spine shows spinal stenosis at L3-L4 and L4-L5. There is no definite nerve root compression but this likely explains her right leg pain. If pain worsens we can refer for an epidural steroid injection (L4-L5)

## 2017-09-17 NOTE — Telephone Encounter (Signed)
Spoke with pt. and reviewed below MRI report.  She verbalized understanding of same.  has lumbar esi sched. for 08/20/17/fim

## 2017-09-19 ENCOUNTER — Ambulatory Visit
Admission: RE | Admit: 2017-09-19 | Discharge: 2017-09-19 | Disposition: A | Discharge status: Home / Self Care | Payer: BLUE CROSS/BLUE SHIELD | Source: Ambulatory Visit | Attending: Family Medicine | Admitting: Family Medicine

## 2017-09-19 ENCOUNTER — Ambulatory Visit
Admission: RE | Admit: 2017-09-19 | Discharge: 2017-09-19 | Disposition: A | Discharge status: Home / Self Care | Payer: BLUE CROSS/BLUE SHIELD | Source: Ambulatory Visit | Attending: Neurology | Admitting: Neurology

## 2017-09-19 DIAGNOSIS — M5412 Radiculopathy, cervical region: Secondary | ICD-10-CM

## 2017-09-19 DIAGNOSIS — Z1231 Encounter for screening mammogram for malignant neoplasm of breast: Secondary | ICD-10-CM | POA: Diagnosis not present

## 2017-09-19 DIAGNOSIS — M47812 Spondylosis without myelopathy or radiculopathy, cervical region: Secondary | ICD-10-CM | POA: Diagnosis not present

## 2017-09-19 DIAGNOSIS — Z1239 Encounter for other screening for malignant neoplasm of breast: Secondary | ICD-10-CM

## 2017-09-19 MED ORDER — TRIAMCINOLONE ACETONIDE 40 MG/ML IJ SUSP (RADIOLOGY)
60.0000 mg | Freq: Once | INTRAMUSCULAR | Status: AC
  Administered 2017-09-19: 60 mg via EPIDURAL

## 2017-09-19 MED ORDER — IOPAMIDOL (ISOVUE-M 300) INJECTION 61%
1.0000 mL | Freq: Once | INTRAMUSCULAR | Status: AC | PRN
  Administered 2017-09-19: 1 mL via EPIDURAL

## 2017-09-19 NOTE — Discharge Instructions (Signed)

## 2017-09-24 ENCOUNTER — Telehealth: Payer: Self-pay | Admitting: Neurology

## 2017-09-24 DIAGNOSIS — M25551 Pain in right hip: Secondary | ICD-10-CM

## 2017-09-24 NOTE — Telephone Encounter (Signed)
The son called concerning the patient.  The patient has recently seen Dr. Felecia Shelling for neck pain and right arm pain, another cervical epidural steroid injection was done.  The patient also has some pain in the right hip area.  MRI of the low back was done, this does not show any evidence of a nerve root impingement, mild arthritis is noted.  No evidence of spinal stenosis is seen.  The patient is limping, having pain with walking.  We will check an x-ray of the right hip, the patient may require an orthopedic surgery referral.

## 2017-10-01 ENCOUNTER — Ambulatory Visit
Admission: RE | Admit: 2017-10-01 | Discharge: 2017-10-01 | Disposition: A | Discharge status: Home / Self Care | Payer: BLUE CROSS/BLUE SHIELD | Source: Ambulatory Visit | Attending: Neurology | Admitting: Neurology

## 2017-10-01 DIAGNOSIS — M25551 Pain in right hip: Secondary | ICD-10-CM

## 2017-10-02 NOTE — Telephone Encounter (Signed)
I can work in this week

## 2017-10-03 ENCOUNTER — Telehealth: Payer: Self-pay | Admitting: Neurology

## 2017-10-03 NOTE — Telephone Encounter (Signed)
Pt is asking for a call from [place], pt chose not to explain reason for call

## 2017-10-03 NOTE — Telephone Encounter (Signed)
X-ray of the hip appears to be unremarkable, it appears that the patient has already been contacted concerning this result.   XR Hip 10/01/17:  IMPRESSION: No fracture or dislocation. No evident arthropathy.

## 2017-10-03 NOTE — Telephone Encounter (Signed)
duplicate task/fim

## 2017-10-03 NOTE — Telephone Encounter (Signed)
I spoke with Deborah [name] this morning.  Over the holiday, she had some right hip pain.  Dr. Jannifer Franklin ordered an x-ray, which was ok.  She sts. her right hip pain is improved.  Neck/arm pain also improved since last esi.  Lower back pain has improved.  She does not need to be seen at this time.  She will call back if sx. return./fim

## 2017-10-10 ENCOUNTER — Ambulatory Visit: Payer: BLUE CROSS/BLUE SHIELD | Admitting: Internal Medicine

## 2017-11-09 ENCOUNTER — Ambulatory Visit: Payer: BLUE CROSS/BLUE SHIELD | Admitting: Internal Medicine

## 2017-12-04 ENCOUNTER — Other Ambulatory Visit: Payer: Self-pay

## 2017-12-04 DIAGNOSIS — I10 Essential (primary) hypertension: Secondary | ICD-10-CM

## 2017-12-04 MED ORDER — VALSARTAN-HYDROCHLOROTHIAZIDE 320-25 MG PO TABS: 1.0000 | ORAL_TABLET | Freq: Every day | ORAL | 3 refills | Status: DC

## 2017-12-27 ENCOUNTER — Telehealth: Payer: Self-pay | Admitting: Internal Medicine

## 2017-12-27 DIAGNOSIS — I10 Essential (primary) hypertension: Secondary | ICD-10-CM

## 2017-12-27 MED ORDER — VALSARTAN-HYDROCHLOROTHIAZIDE 320-25 MG PO TABS: 1.0000 | ORAL_TABLET | Freq: Every day | ORAL | 3 refills | Status: DC

## 2017-12-27 NOTE — Telephone Encounter (Signed)
Pt needs Rx for valsartan sent to [facility].  [facility] did not allow her to get the prescription because she has to go thru [facility]. Please advise

## 2017-12-27 NOTE — Telephone Encounter (Signed)
Resent as requested to mail order pharmacy. Tyson Masin, Salome Spotted, CMA

## 2017-12-28 ENCOUNTER — Ambulatory Visit (INDEPENDENT_AMBULATORY_CARE_PROVIDER_SITE_OTHER): Payer: BLUE CROSS/BLUE SHIELD | Admitting: Neurology

## 2017-12-28 ENCOUNTER — Encounter: Payer: Self-pay | Admitting: Neurology

## 2017-12-28 VITALS — BP 148/92 | HR 82 | Resp 16 | Ht 62.0 in | Wt 154.0 lb

## 2017-12-28 DIAGNOSIS — M5412 Radiculopathy, cervical region: Secondary | ICD-10-CM

## 2017-12-28 DIAGNOSIS — M5416 Radiculopathy, lumbar region: Secondary | ICD-10-CM | POA: Diagnosis not present

## 2017-12-28 DIAGNOSIS — R0683 Snoring: Secondary | ICD-10-CM

## 2017-12-28 DIAGNOSIS — M4802 Spinal stenosis, cervical region: Secondary | ICD-10-CM

## 2017-12-28 MED ORDER — GABAPENTIN 300 MG PO CAPS: 300.0000 mg | ORAL_CAPSULE | Freq: Three times a day (TID) | ORAL | 3 refills | Status: DC

## 2017-12-28 NOTE — Progress Notes (Addendum)
[facility]  PATIENT: Deborah [name] DOB: Sep 13, 1956  REFERRING DOCTOR OR PCP:  ED  SOURCE: patient, ED notes, lab and imaging results, CT scan images on PACS.  _________________________________   HISTORICAL  CHIEF COMPLAINT:  Chief Complaint  Patient presents with  . Neck Pain    Sts. neck and back pain are about the same.  Good days and bad, depending on activity. /fim  . Back Pain    HISTORY OF PRESENT ILLNESS:  Deborah [name] is a 62 year old woman with a right cervical radiculopathy.   Update 12/28/2017: She reports that her right arm pan and back pain are doing about the same.  Pain will sometimes increase with activity.  Arm pain is > LBP/leg pain  Late last year, the neck pain in the right arm pain improved after she had an epidural steroid injection.  A second ESI a few months ago also helped a while.    In general her pain is better than before the first ESI.    She does not note neck pain but does have right arm pain.   She has difficulty at work due to the pain.  Pain is reduced by holding arm to upper chest and worse if she is standing straight with arm down.   On MRI she does have disc protrusion with mild to moderate spinal stenosis at C4-C5 though there is no definite nerve root compression.   She is on gabapentin and is not sure how much it helps.     Her back pain will radiate down the right leg at times usually the outer leg to the mid calf.  Pain will be worse with walking and sometimes better when she leans forward.   MRI shows a protrusion at L3L4 with foraminal narrowing on the right.     Update 08/30/2017:  Her neck pain and right arm pain improved after an epidural steroid injection but has since returned and she is wondering about another epidural. MRI of the cervical spine showed disc protrusion and mild to moderate spinal stenosis at C4-C5 and milder degenerative changes at C5-C6 and C6-C7. There is no definite nerve root  compression.  She also reports low back pain that radiates down the outer right leg to the midcalf. Pain is achy and worse with walking.   When pain goes down the leg, it goes down the lateral upper leg to just below the knee where it is more anterolateral.. Pain does not enter the foot. Sitting reduces her pain and she leans forward with more benefit. This has been going on for the past 4-5 months.    She notes that she is limping some.    A prednisone pack has mildly helped.   She has tried walking as exercise but can't walk far without pain worsening.   She notes leaning forward more with walking.    She is feeling more tired and sleepy.   She denies depression.   She snores and has woken herself up gasping a few times.   This seems better since the [facility] with improved sleep.    She will nap after nap and sometimes falls asleep in the evening.  EPWORTH SLEEPINESS SCALE  On a scale of 0 - 3 what is the chance of dozing:  Sitting and Reading:   0 Watching TV:    0 Sitting inactive in a public place: 0 Passenger in car for one hour: 3 Lying down to rest in the afternoon: 3 Sitting and  talking to someone: 0 Sitting quietly after lunch:  2 In a car, stopped in traffic:  0  Total (out of 24):  8/24   Update 06/26/2017:   Initially, as she had hypertension, when she went to the emergency room she was evaluated for a cardiac event. She was referred to me for the neck and arm pain.   I reviewed the MRI personally in the presence of King[place] and her husband.     The MRI shows borderline retrolisthesis at C4-C5 associated with reduced disc height, disc protrusion and uncovertebral spurring causing moderate spinal stenosis and mild bilateral foraminal narrowing. At C5-C6 there is disc protrusion and uncovertebral spurring causing mild spinal stenosis and mild bilateral foraminal narrowing at C6-C7 there is a small left paramedian disc protrusion but no nerve root compression. She was started on gabapentin  and the gabapentin helped her rest better at night but incompletely helped her pain. She was referred for an epidural steroid injection. She underwent this on 06/18/2017 (to the right at C7T1).    Since then, she feels her pain is much better. She now does not experience any pain in the right arm or the neck. She is sleeping even better at night.    She denies any other neurologic symptoms. Specifically there is no numbness or weakness. She does not note any bladder difficulty.    At the initial visit, there was mild weakness in the right triceps and normal sensory exam.  ______________________________________________ From 05/10/2017  She has had pain and numbness in the right arm for 6 months that worsened over the past week and she went to the ED 2 days ago.    She has not had much neck pain.     She notes that the pain will radiate from the shoulder into the hand, involving the third and fourth fingers the most.   Generally, pain is worse when she is standing with the arm by her side or when she lays down with the arm by her side. Having the arm over her chest will usually decrease the pain.   She also notes that there is some weakness in the arm. When the arm gets numb and painful with the weakness seems to be worse.    She does not note any weakness in the legs. There is no leg numbness. Bladder function is unchanged.  In the Emergency room, she had a CT scan of the head. I personally reviewed the images and it is normal. She also had a chest x-ray was read as normal except for aortic atherosclerosis.   She was given aspirin and had lab work. An EKG was performed that showed normal rate, rhythm, and intervals.  Anterior infarct could not be ruled out but there was no evidence of a STEMI.  She was discharged with cardiology in neurology follow-up.  REVIEW OF SYSTEMS: Constitutional: No fevers, chills, sweats, or change in appetite Eyes: No visual changes, double vision, eye pain Ear, nose and throat:  No hearing loss, ear pain, nasal congestion, sore throat Cardiovascular: No chest pain, palpitations Respiratory: No shortness of breath at rest or with exertion.   No wheezes GastrointestinaI: No nausea, vomiting, diarrhea, abdominal pain, fecal incontinence Genitourinary: No dysuria, urinary retention or frequency.  No nocturia. Musculoskeletal: No neck pain, back pain Integumentary: No rash, pruritus, skin lesions Neurological: as above Psychiatric: No depression at this time.  No anxiety Endocrine: No palpitations, diaphoresis, change in appetite, change in weigh or increased thirst Hematologic/Lymphatic: No  anemia, purpura, petechiae. Allergic/Immunologic: No itchy/runny eyes, nasal congestion, recent allergic reactions, rashes  ALLERGIES: No Known Allergies  HOME MEDICATIONS:  Current Outpatient Medications:  .  ASPIRIN 81 PO, Take by mouth., Disp: , Rfl:  .  etodolac (LODINE) 400 MG tablet, Take 1 tablet (400 mg total) by mouth 2 (two) times daily., Disp: 60 tablet, Rfl: 5 .  gabapentin (NEURONTIN) 300 MG capsule, Take 1 capsule (300 mg total) by mouth 3 (three) times daily., Disp: 270 capsule, Rfl: 3 .  Ibuprofen (ADVIL PO), Take by mouth., Disp: , Rfl:  .  valsartan-hydrochlorothiazide (DIOVAN-HCT) 320-25 MG tablet, Take 1 tablet by mouth daily., Disp: 90 tablet, Rfl: 3 .  methylPREDNISolone (MEDROL DOSEPAK) 4 MG TBPK tablet, 6 po x 1 d, then 5 po x 1d, then 4 po x 1d, then 3 po x 1d, then 2 po x 1d, then 1 po, Disp: 21 tablet, Rfl: 0 .  predniSONE (DELTASONE) 20 MG tablet, Take 1 tablet (20 mg total) daily with breakfast by mouth., Disp: 5 tablet, Rfl: 0  PAST MEDICAL HISTORY: Past Medical History:  Diagnosis Date  . Hypertension     PAST SURGICAL HISTORY: History reviewed. No pertinent surgical history.  FAMILY HISTORY: Family History  Problem Relation Age of Onset  . Ovarian cancer Mother   . Stroke Father   . Breast cancer Sister   . Stroke Brother      SOCIAL HISTORY:  Social History   Socioeconomic History  . Marital status: Married    Spouse name: Not on file  . Number of children: Not on file  . Years of education: Not on file  . Highest education level: Not on file  Occupational History  . Occupation: [employer]   Social Needs  . Financial resource strain: Not on file  . Food insecurity:    Worry: Not on file    Inability: Not on file  . Transportation needs:    Medical: Not on file    Non-medical: Not on file  Tobacco Use  . Smoking status: Never Smoker  . Smokeless tobacco: Never Used  Substance and Sexual Activity  . Alcohol use: No  . Drug use: No  . Sexual activity: Yes    Partners: Male    Comment: Husband   Lifestyle  . Physical activity:    Days per week: Not on file    Minutes per session: Not on file  . Stress: Not on file  Relationships  . Social connections:    Talks on phone: Not on file    Gets together: Not on file    Attends religious service: Not on file    Active member of club or organization: Not on file    Attends meetings of clubs or organizations: Not on file    Relationship status: Not on file  . Intimate partner violence:    Fear of current or ex partner: Not on file    Emotionally abused: Not on file    Physically abused: Not on file    Forced sexual activity: Not on file  Other Topics Concern  . Not on file  Social History Narrative   Married   Patient  Use to work in [employer] - 24 (daughter)and 30 years (son)   Patient helps with granddaughter      PHYSICAL EXAM  Vitals:   12/28/17 1104  BP: (!) 148/92  Pulse: 82  Resp: 16  Weight: 154 lb (69.9 kg)  Height: 5\' 2"  (1.575 m)    Body mass index is 28.17 kg/m.   General: The patient is well-developed and well-nourished and in no acute distress   Neck: The neck is supple, no carotid bruits are noted.  The neck is nontender.   Neurologic Exam  Mental status: The patient is alert and oriented x  3 at the time of the examination. The patient has apparent normal recent and remote memory, with an apparently normal attention span and concentration ability.   Speech is normal.  Cranial nerves: Extraocular movements are full. Facial strength and sensation is normal.   No obvious hearing deficits are noted.  Motor:  Muscle bulk is normal.   Muscle tone is normal.  Strength is fairly normal in the arms and legs today.  Sensory: She has intact touch and vibration in the arms and legs.   Gait and station: Station is normal.   Gait is normal.   Tandem gait is mildly wide.  The Romberg is negative  Reflexes: Deep tendon reflexes are normal and symmetric at the biceps. DTRs are 1+ at the right triceps and 2+ at the left triceps. DTRs are trace in  the legs    DIAGNOSTIC DATA (LABS, IMAGING, TESTING) - I reviewed patient records, labs, notes, testing and imaging myself where available.  Lab Results  Component Value Date   WBC 8.0 07/27/2017   HGB 14.1 07/27/2017   HCT 42.2 07/27/2017   MCV 87 07/27/2017   PLT 289 07/27/2017      Component Value Date/Time   NA 139 07/27/2017 1422   K 4.2 07/27/2017 1422   CL 100 07/27/2017 1422   CO2 26 07/27/2017 1422   GLUCOSE 101 (H) 07/27/2017 1422   GLUCOSE 130 (H) 05/08/2017 1023   BUN 16 07/27/2017 1422   CREATININE 0.81 07/27/2017 1422   CALCIUM 9.4 07/27/2017 1422   GFRNONAA 78 07/27/2017 1422   GFRAA 90 07/27/2017 1422         ASSESSMENT AND PLAN  Cervical radiculopathy  Lumbar radiculopathy - Plan: gabapentin (NEURONTIN) 300 MG capsule  Cervical spinal stenosis  Snoring   1.    Advised to take gabapentin on a regular basis.  If this does not help, I will refer to neurosurgery for her spinal stenosis at C4-C5  2.     The MRI of the lumbar spine is consistent with a L4 radiculopathy.  Hopefully this pain will also improve with the gabapentin on a regular basis.  If this worsens, consider ESI.  If that is not beneficial,  consider referral to surgery. 3.    She has snoring and possible OSA.  She does not have daytime sleepiness, however.  If this worsens, consider a sleep study.  She would prefer not to do at this time.   4.    She works as a Scientist, water quality.  Provided a note to avoid sustained lifting more than 10 pounds and repetitive reaching over her head.   5.    She will return in 4-6 months or as needed if there are new or worsening neurologic symptoms.   Bertin Inabinet A. Felecia Shelling, MD, [facility] 2/35/5732, 2:02 PM Certified in Neurology, Clinical Neurophysiology, Sleep Medicine, Pain Medicine and Neuroimaging  [facility] [address] [place], [place] [address] [phone]

## 2018-01-09 DIAGNOSIS — M9902 Segmental and somatic dysfunction of thoracic region: Secondary | ICD-10-CM | POA: Diagnosis not present

## 2018-01-09 DIAGNOSIS — M791 Myalgia, unspecified site: Secondary | ICD-10-CM | POA: Diagnosis not present

## 2018-01-09 DIAGNOSIS — M9901 Segmental and somatic dysfunction of cervical region: Secondary | ICD-10-CM | POA: Diagnosis not present

## 2018-01-09 DIAGNOSIS — R51 Headache: Secondary | ICD-10-CM | POA: Diagnosis not present

## 2018-02-01 ENCOUNTER — Ambulatory Visit (INDEPENDENT_AMBULATORY_CARE_PROVIDER_SITE_OTHER): Payer: BLUE CROSS/BLUE SHIELD | Admitting: Internal Medicine

## 2018-02-01 ENCOUNTER — Other Ambulatory Visit (HOSPITAL_COMMUNITY)
Admission: RE | Admit: 2018-02-01 | Discharge: 2018-02-01 | Disposition: A | Discharge status: Home / Self Care | Payer: BLUE CROSS/BLUE SHIELD | Source: Ambulatory Visit | Attending: Family Medicine | Admitting: Family Medicine

## 2018-02-01 ENCOUNTER — Encounter: Payer: Self-pay | Admitting: Internal Medicine

## 2018-02-01 VITALS — BP 135/70 | HR 72 | Temp 98.6°F | Wt 153.6 lb

## 2018-02-01 DIAGNOSIS — I10 Essential (primary) hypertension: Secondary | ICD-10-CM | POA: Insufficient documentation

## 2018-02-01 DIAGNOSIS — E781 Pure hyperglyceridemia: Secondary | ICD-10-CM

## 2018-02-01 DIAGNOSIS — Z1159 Encounter for screening for other viral diseases: Secondary | ICD-10-CM | POA: Insufficient documentation

## 2018-02-01 DIAGNOSIS — Z0000 Encounter for general adult medical examination without abnormal findings: Secondary | ICD-10-CM

## 2018-02-01 DIAGNOSIS — Z114 Encounter for screening for human immunodeficiency virus [HIV]: Secondary | ICD-10-CM

## 2018-02-01 DIAGNOSIS — Z01419 Encounter for gynecological examination (general) (routine) without abnormal findings: Secondary | ICD-10-CM

## 2018-02-01 DIAGNOSIS — Z124 Encounter for screening for malignant neoplasm of cervix: Secondary | ICD-10-CM | POA: Diagnosis not present

## 2018-02-01 LAB — POCT WET PREP (WET MOUNT)
Clue Cells Wet Prep Whiff POC: NEGATIVE
Trichomonas Wet Prep HPF POC: ABSENT

## 2018-02-01 NOTE — Progress Notes (Signed)
   [facility] Kerrin Mo, MD Phone: [phone]  Reason For Visit: Follow up   # CHRONIC HTN: Reports she states that her blood pressures are often elevated averaging 140/90 sometimes a little bit higher. Current Meds - Diovan  Reports good compliance, took meds today. Tolerating well, w/o complaints. Lifestyle -patient limits her salt intake.  Recommend decreasing calorie intake to help with some weight loss.  Also recommended exercise Denies CP, dyspnea, HA, edema, dizziness / lightheadedness  #Women's Health  -  Post menopausal  -  No family hx of breast cancer  -  Does not do regular do breast exam  -  No vaginal discharged   Past Medical History Reviewed problem list.  Medications- reviewed and updated No additions to family history Social history- patient is a non- smoker  Objective: BP 135/70 (BP Location: Right Arm, Patient Position: Sitting, Cuff Size: Normal)   Pulse 72   Temp 98.6 F (37 C) (Oral)   Wt 153 lb 9.6 oz (69.7 kg)   SpO2 97%   BMI 28.09 kg/m  Gen: NAD, alert, cooperative with exam Cardio: regular rate and rhythm, S1S2 heard, no murmurs appreciated Pulm: clear to auscultation bilaterally, no wheezes, rhonchi or rales GU: external vaginal tissue wnl, cervix wnl, no discharge from cervical os, no cervical motion tenderness, no abdominal/ adnexal masses Extremities: warm, well perfused, No edema Skin: dry, intact, no rashes or lesions  Assessment/Plan: See problem based a/p  Essential hypertension Currently well controlled, though patient states that her blood pressures are 140/90 at home and sometimes higher. She wants to hold any further medication for now  - Continue Diovan  - Follow up in 6 months  Healthcare maintenance Pap smear today  Colonoscopy handout given Hepatitis and HIV obtained today Tetanus apparently done in [place] previously

## 2018-02-01 NOTE — Assessment & Plan Note (Signed)
Recheck at next appointment

## 2018-02-01 NOTE — Patient Instructions (Addendum)
Was nice seeing you today.  If your blood pressures remain elevated as they have been I would like you to start taking a second medication.  Your blood pressure should be around 130/80.  For your health I recommend you doing the colonoscopy. you are up-to-date on your mammogram currently.  Follow-up in about 6 months

## 2018-02-01 NOTE — Assessment & Plan Note (Signed)
Pap smear today  Colonoscopy handout given Hepatitis and HIV obtained today Tetanus apparently done in [place] previously

## 2018-02-01 NOTE — Assessment & Plan Note (Signed)
Currently well controlled, though patient states that her blood pressures are 140/90 at home and sometimes higher. She wants to hold any further medication for now  - Continue Diovan  - Follow up in 6 months

## 2018-02-02 LAB — HIV ANTIBODY (ROUTINE TESTING W REFLEX): HIV SCREEN 4TH GENERATION: NONREACTIVE

## 2018-02-02 LAB — HEPATITIS C ANTIBODY (REFLEX)

## 2018-02-02 LAB — HCV COMMENT:

## 2018-02-02 LAB — RPR: RPR: NONREACTIVE

## 2018-02-04 LAB — CERVICOVAGINAL ANCILLARY ONLY
Chlamydia: NEGATIVE
NEISSERIA GONORRHEA: NEGATIVE

## 2018-02-05 LAB — CYTOLOGY - PAP
Diagnosis: NEGATIVE
HPV (WINDOPATH): NOT DETECTED

## 2018-03-14 DIAGNOSIS — Z0289 Encounter for other administrative examinations: Secondary | ICD-10-CM

## 2018-05-23 DIAGNOSIS — Z7689 Persons encountering health services in other specified circumstances: Secondary | ICD-10-CM | POA: Diagnosis not present

## 2018-05-23 DIAGNOSIS — I10 Essential (primary) hypertension: Secondary | ICD-10-CM | POA: Diagnosis not present

## 2018-05-23 DIAGNOSIS — E7800 Pure hypercholesterolemia, unspecified: Secondary | ICD-10-CM | POA: Diagnosis not present

## 2018-07-03 ENCOUNTER — Ambulatory Visit: Payer: BLUE CROSS/BLUE SHIELD | Admitting: Neurology

## 2018-07-06 DIAGNOSIS — R1084 Generalized abdominal pain: Secondary | ICD-10-CM | POA: Diagnosis not present

## 2018-07-06 DIAGNOSIS — R05 Cough: Secondary | ICD-10-CM | POA: Diagnosis not present

## 2018-07-06 DIAGNOSIS — J069 Acute upper respiratory infection, unspecified: Secondary | ICD-10-CM | POA: Diagnosis not present

## 2018-07-06 DIAGNOSIS — K219 Gastro-esophageal reflux disease without esophagitis: Secondary | ICD-10-CM | POA: Diagnosis not present

## 2018-08-09 DIAGNOSIS — R1084 Generalized abdominal pain: Secondary | ICD-10-CM | POA: Diagnosis not present

## 2018-10-04 DIAGNOSIS — Z9119 Patient's noncompliance with other medical treatment and regimen: Secondary | ICD-10-CM | POA: Diagnosis not present

## 2018-10-04 DIAGNOSIS — R9389 Abnormal findings on diagnostic imaging of other specified body structures: Secondary | ICD-10-CM | POA: Diagnosis not present

## 2018-10-04 DIAGNOSIS — I10 Essential (primary) hypertension: Secondary | ICD-10-CM | POA: Diagnosis not present

## 2018-10-04 DIAGNOSIS — K760 Fatty (change of) liver, not elsewhere classified: Secondary | ICD-10-CM | POA: Diagnosis not present

## 2018-11-20 DIAGNOSIS — I10 Essential (primary) hypertension: Secondary | ICD-10-CM | POA: Diagnosis not present

## 2018-11-20 DIAGNOSIS — E7800 Pure hypercholesterolemia, unspecified: Secondary | ICD-10-CM | POA: Diagnosis not present

## 2018-11-20 DIAGNOSIS — E559 Vitamin D deficiency, unspecified: Secondary | ICD-10-CM | POA: Diagnosis not present

## 2018-11-20 DIAGNOSIS — R5383 Other fatigue: Secondary | ICD-10-CM | POA: Diagnosis not present

## 2019-08-26 NOTE — Progress Notes (Signed)
Subjective:    Patient ID: Deborah [name], female    DOB: 1956/03/13, 63 y.o.   MRN: [id number]   CC: New Patient  HPI: PMHx: Past Medical History:  Diagnosis Date  . Hypertension      Surgical Hx: Past Surgical History:  Procedure Laterality Date  . carpel tunnel Right 2005  . CESAREAN SECTION  05/14/1993     Family Hx: Family History  Problem Relation Age of Onset  . Ovarian cancer Mother   . Stroke Father   . Breast cancer Sister   . Stroke Brother        in his early thirties     Social Hx: Current Social History      Who lives at home: husband 08/27/2019  Who would speak for you about health care matters: husband 08/27/2019  Transportation: husband drives a car [id number] Important Relationships & Pets: none 08/27/2019  Current Stressors: none 08/27/2019 Work / Education:  Not currently working, highest level of education is vocational training/high school  08/27/2019 Religious / Personal Beliefs: no pork related products 08/27/2019 Interests / Fun: visits grand daughter, watches movies  08/27/2019 Other: see social history section of EMR 08/27/2019   Medications: ASA 81 mg (primary prevention per patient) Ibuprofen PRN Valsartan-HCTZ 320-25  ROS: Woman:  Patient reports no  vision/ hearing changes,anorexia, weight change, fever ,adenopathy, persistant / recurrent hoarseness, swallowing issues, chest pain, edema,persistant / recurrent cough, hemoptysis, dyspnea(rest, exertional, paroxysmal nocturnal), gastrointestinal  bleeding (melena, rectal bleeding), abdominal pain, excessive heart burn, GU symptoms(dysuria, hematuria, pyuria, voiding/incontinence  Issues) syncope, focal weakness, severe memory loss, concerning skin lesions, depression, anxiety, abnormal bruising/bleeding, major joint swelling, breast masses or abnormal vaginal bleeding.    Preventative Screening Colonoscopy: due, will send in referral  Mammogram: year 2018 results negative  Pap test: year 2019 results negative DEXA: N/A Tetanus vaccine: patient states she is UTD but not in our records, will await request of records  Pneumonia vaccine: N/A Shingles vaccine: will obtain after the holidays  Objective:  BP 140/75   Pulse 75   Wt 139 lb 3.2 oz (63.1 kg)   SpO2 100%   BMI 25.46 kg/m  Vitals and nursing note reviewed  General: well nourished, in no acute distress HEENT: normocephalic, TM's visualized bilaterally, no scleral icterus or conjunctival pallor, no nasal discharge, moist mucous membranes, good dentition without erythema or discharge noted in posterior oropharynx Neck: supple, non-tender, without lymphadenopathy Cardiac: RRR, clear S1 and S2, no murmurs, rubs, or gallops Respiratory: clear to auscultation bilaterally, no increased work of breathing Abdomen: soft, nontender, nondistended, no masses or organomegaly. Bowel sounds present Extremities: no edema or cyanosis. Warm, well perfused. 2+ radial and PT pulses bilaterally Skin: warm and dry, no rashes noted Neuro: alert and oriented, no focal deficits, 5/5 muscle strength bilaterally    Assessment & Plan:    Essential hypertension Well-controlled per JNC 8 guidelines.  We will continue current regimen.  Follow-up in 3 months.  Patient seems to be on aspirin for primary prevention.  Discussed this with patient.  She is unsure why they put her on aspirin.  Will await release of records so that I maintain more history.  If this is truly for primary prevention can discuss with patient if she would like to discontinue this.  BMP obtained today.  Healthcare maintenance Flu vaccine given today.  Shingrix will be obtained after the holidays.  Colonoscopy referral sent  Hypertriglyceridemia Lipid panel ordered today    Return in about 3  months (around 11/27/2019) for Blood pressure follow up.   Caroline More, DO, PGY-3

## 2019-08-27 ENCOUNTER — Other Ambulatory Visit: Payer: Self-pay

## 2019-08-27 ENCOUNTER — Ambulatory Visit (INDEPENDENT_AMBULATORY_CARE_PROVIDER_SITE_OTHER): Payer: Medicare Other | Admitting: Family Medicine

## 2019-08-27 ENCOUNTER — Encounter: Payer: Self-pay | Admitting: Family Medicine

## 2019-08-27 VITALS — BP 140/75 | HR 75 | Wt 139.2 lb

## 2019-08-27 DIAGNOSIS — I10 Essential (primary) hypertension: Secondary | ICD-10-CM

## 2019-08-27 DIAGNOSIS — Z0000 Encounter for general adult medical examination without abnormal findings: Secondary | ICD-10-CM

## 2019-08-27 DIAGNOSIS — Z1211 Encounter for screening for malignant neoplasm of colon: Secondary | ICD-10-CM

## 2019-08-27 DIAGNOSIS — E7849 Other hyperlipidemia: Secondary | ICD-10-CM

## 2019-08-27 DIAGNOSIS — E781 Pure hyperglyceridemia: Secondary | ICD-10-CM

## 2019-08-27 DIAGNOSIS — Z23 Encounter for immunization: Secondary | ICD-10-CM | POA: Diagnosis not present

## 2019-08-27 MED ORDER — VALSARTAN-HYDROCHLOROTHIAZIDE 320-25 MG PO TABS: 1.0000 | ORAL_TABLET | Freq: Every day | ORAL | 3 refills | Status: DC

## 2019-08-27 NOTE — Assessment & Plan Note (Addendum)
Well-controlled per JNC 8 guidelines.  We will continue current regimen.  Follow-up in 3 months.  Patient seems to be on aspirin for primary prevention.  Discussed this with patient.  She is unsure why they put her on aspirin.  Will await release of records so that I maintain more history.  If this is truly for primary prevention can discuss with patient if she would like to discontinue this.  BMP obtained today.

## 2019-08-27 NOTE — Assessment & Plan Note (Signed)
Lipid panel ordered today

## 2019-08-27 NOTE — Patient Instructions (Signed)
Healthy Eating Following a healthy eating pattern may help you to achieve and maintain a healthy body weight, reduce the risk of chronic disease, and live a long and productive life. It is important to follow a healthy eating pattern at an appropriate calorie level for your body. Your nutritional needs should be met primarily through food by choosing a variety of nutrient-rich foods. What are tips for following this plan? Reading food labels  Read labels and choose the following: ? Reduced or low sodium. ? Juices with 100% fruit juice. ? Foods with low saturated fats and high polyunsaturated and monounsaturated fats. ? Foods with whole grains, such as whole wheat, cracked wheat, brown rice, and wild rice. ? Whole grains that are fortified with folic acid. This is recommended for women who are pregnant or who want to become pregnant.  Read labels and avoid the following: ? Foods with a lot of added sugars. These include foods that contain brown sugar, corn sweetener, corn syrup, dextrose, fructose, glucose, high-fructose corn syrup, honey, invert sugar, lactose, malt syrup, maltose, molasses, raw sugar, sucrose, trehalose, or turbinado sugar.  Do not eat more than the following amounts of added sugar per day:  6 teaspoons (25 g) for women.  9 teaspoons (38 g) for men. ? Foods that contain processed or refined starches and grains. ? Refined grain products, such as white flour, degermed cornmeal, white bread, and white rice. Shopping  Choose nutrient-rich snacks, such as vegetables, whole fruits, and nuts. Avoid high-calorie and high-sugar snacks, such as potato chips, fruit snacks, and candy.  Use oil-based dressings and spreads on foods instead of solid fats such as butter, stick margarine, or cream cheese.  Limit pre-made sauces, mixes, and "instant" products such as flavored rice, instant noodles, and ready-made pasta.  Try more plant-protein sources, such as tofu, tempeh, black beans,  edamame, lentils, nuts, and seeds.  Explore eating plans such as the Mediterranean diet or vegetarian diet. Cooking  Use oil to saut or stir-fry foods instead of solid fats such as butter, stick margarine, or lard.  Try baking, boiling, grilling, or broiling instead of frying.  Remove the fatty part of meats before cooking.  Steam vegetables in water or broth. Meal planning   At meals, imagine dividing your plate into fourths: ? One-half of your plate is fruits and vegetables. ? One-fourth of your plate is whole grains. ? One-fourth of your plate is protein, especially lean meats, poultry, eggs, tofu, beans, or nuts.  Include low-fat dairy as part of your daily diet. Lifestyle  Choose healthy options in all settings, including home, work, school, restaurants, or stores.  Prepare your food safely: ? Wash your hands after handling raw meats. ? Keep food preparation surfaces clean by regularly washing with hot, soapy water. ? Keep raw meats separate from ready-to-eat foods, such as fruits and vegetables. ? Cook seafood, meat, poultry, and eggs to the recommended internal temperature. ? Store foods at safe temperatures. In general:  Keep cold foods at 59F (4.4C) or below.  Keep hot foods at 159F (60C) or above.  Keep your freezer at [facility] (-17.8C) or below.  Foods are no longer safe to eat when they have been between the temperatures of 40-159F (4.4-60C) for more than 2 hours. What foods should I eat? Fruits Aim to eat 2 cup-equivalents of fresh, canned (in natural juice), or frozen fruits each day. Examples of 1 cup-equivalent of fruit include 1 small apple, 8 large strawberries, 1 cup canned fruit,  cup  dried fruit, or 1 cup 100% juice. Vegetables Aim to eat 2-3 cup-equivalents of fresh and frozen vegetables each day, including different varieties and colors. Examples of 1 cup-equivalent of vegetables include 2 medium carrots, 2 cups raw, leafy greens, 1 cup chopped  vegetable (raw or cooked), or 1 medium baked potato. Grains Aim to eat 6 ounce-equivalents of whole grains each day. Examples of 1 ounce-equivalent of grains include 1 slice of bread, 1 cup ready-to-eat cereal, 3 cups popcorn, or  cup cooked rice, pasta, or cereal. Meats and other proteins Aim to eat 5-6 ounce-equivalents of protein each day. Examples of 1 ounce-equivalent of protein include 1 egg, 1/2 cup nuts or seeds, or 1 tablespoon (16 g) peanut butter. A cut of meat or fish that is the size of a deck of cards is about 3-4 ounce-equivalents.  Of the protein you eat each week, try to have at least 8 ounces come from seafood. This includes salmon, trout, herring, and anchovies. Dairy Aim to eat 3 cup-equivalents of fat-free or low-fat dairy each day. Examples of 1 cup-equivalent of dairy include 1 cup (240 mL) milk, 8 ounces (250 g) yogurt, 1 ounces (44 g) natural cheese, or 1 cup (240 mL) fortified soy milk. Fats and oils  Aim for about 5 teaspoons (21 g) per day. Choose monounsaturated fats, such as canola and olive oils, avocados, peanut butter, and most nuts, or polyunsaturated fats, such as sunflower, corn, and soybean oils, walnuts, pine nuts, sesame seeds, sunflower seeds, and flaxseed. Beverages  Aim for six 8-oz glasses of water per day. Limit coffee to three to five 8-oz cups per day.  Limit caffeinated beverages that have added calories, such as soda and energy drinks.  Limit alcohol intake to no more than 1 drink a day for nonpregnant women and 2 drinks a day for men. One drink equals 12 oz of beer (355 mL), 5 oz of wine (148 mL), or 1 oz of hard liquor (44 mL). Seasoning and other foods  Avoid adding excess amounts of salt to your foods. Try flavoring foods with herbs and spices instead of salt.  Avoid adding sugar to foods.  Try using oil-based dressings, sauces, and spreads instead of solid fats. This information is based on general [place] nutrition guidelines. For more  information, visit choosemyplate.gov. Exact amounts may vary based on your nutrition needs. Summary  A healthy eating plan may help you to maintain a healthy weight, reduce the risk of chronic diseases, and stay active throughout your life.  Plan your meals. Make sure you eat the right portions of a variety of nutrient-rich foods.  Try baking, boiling, grilling, or broiling instead of frying.  Choose healthy options in all settings, including home, work, school, restaurants, or stores. This information is not intended to replace advice given to you by your health care provider. Make sure you discuss any questions you have with your health care provider. Document Released: 12/31/2017 Document Revised: 12/31/2017 Document Reviewed: 12/31/2017 Elsevier Patient Education  2020 [employer]  

## 2019-08-27 NOTE — Assessment & Plan Note (Addendum)
Flu vaccine given today.  Shingrix will be obtained after the holidays.  Colonoscopy referral sent

## 2019-08-28 LAB — BASIC METABOLIC PANEL
BUN/Creatinine Ratio: 19 (ref 12–28)
BUN: 13 mg/dL (ref 8–27)
CO2: 27 mmol/L (ref 20–29)
Calcium: 9.9 mg/dL (ref 8.7–10.3)
Chloride: 99 mmol/L (ref 96–106)
Creatinine, Ser: 0.68 mg/dL (ref 0.57–1.00)
GFR calc Af Amer: 107 mL/min/{1.73_m2} (ref >59)
GFR calc non Af Amer: 93 mL/min/{1.73_m2} (ref >59)
Glucose: 79 mg/dL (ref 65–99)
Potassium: 3.9 mmol/L (ref 3.5–5.2)
Sodium: 140 mmol/L (ref 134–144)

## 2019-08-28 LAB — LIPID PANEL
Chol/HDL Ratio: 5.5 ratio — ABNORMAL HIGH (ref 0.0–4.4)
Cholesterol, Total: 240 mg/dL — ABNORMAL HIGH (ref 100–199)
HDL: 44 mg/dL (ref >39)
LDL Chol Calc (NIH): 149 mg/dL — ABNORMAL HIGH (ref 0–99)
Triglycerides: 256 mg/dL — ABNORMAL HIGH (ref 0–149)
VLDL Cholesterol Cal: 47 mg/dL — ABNORMAL HIGH (ref 5–40)

## 2019-09-01 ENCOUNTER — Other Ambulatory Visit: Payer: Self-pay | Admitting: Family Medicine

## 2019-09-01 ENCOUNTER — Telehealth: Payer: Self-pay

## 2019-09-01 MED ORDER — ATORVASTATIN CALCIUM 40 MG PO TABS: 40.0000 mg | ORAL_TABLET | Freq: Every day | ORAL | 1 refills | Status: DC

## 2019-09-01 MED ORDER — VALSARTAN 320 MG PO TABS: 320.0000 mg | ORAL_TABLET | Freq: Every day | ORAL | 1 refills | Status: DC

## 2019-09-01 MED ORDER — HYDROCHLOROTHIAZIDE 25 MG PO TABS: 25.0000 mg | ORAL_TABLET | Freq: Every day | ORAL | 1 refills | Status: DC

## 2019-09-01 NOTE — Telephone Encounter (Signed)
Please inform patient that due to cost I have sent in separate RX for valsartan and HCTZ instead of combined pill. She should check her BP at home daily and keep a log. She should follow up in 1 month for BP follow up  Deborah More, DO, PGY-3 [facility] 09/01/2019 10:15 AM

## 2019-09-01 NOTE — Telephone Encounter (Signed)
Patient calls nurse line stating Diovan-HCT will cost her ~600 dollars with her new insurance. Patient is requesting an alternative to be sent to her pharmacy. Please advise.

## 2019-09-01 NOTE — Telephone Encounter (Signed)
Informed patient that new RX is at pharmacy and that she should follow up in 1 month for blood pressure.  Advised to also monitor blood pressure at home on a daily basis per PCP and keep a log.  Patient verbally acknowledged understanding.  Deborah [name], [place]

## 2019-09-01 NOTE — Progress Notes (Signed)
Called patient to discuss results of lipid panel.   The 10-year ASCVD risk score Mikey Bussing DC Brooke Bonito., et al., 2013) is: 13.4%   Values used to calculate the score:     Age: 63 years     Sex: Female     Is Non-Hispanic African American: Yes     Diabetic: No     Tobacco smoker: No     Systolic Blood Pressure: [id number] mmHg     Is BP treated: Yes     HDL Cholesterol: 44 mg/dL     Total Cholesterol: 240 mg/dL   Given ASCVD risk of 13.4% with history of HTN advised to start statin therapy. Patient agreeable. She has been implementing lifestyle changes for some time now without significant improvement. Advised to continue healthy diet as well as daily exercise in addition to statin.   Will start atorvastatin 40mg  daily. If well tolerated can increase to 80mg .   Follow up in 1 month.   Dalphine Handing, PGY-3 [facility] 09/01/2019 11:01 AM

## 2019-09-03 ENCOUNTER — Encounter: Payer: Self-pay | Admitting: Gastroenterology

## 2019-09-29 ENCOUNTER — Other Ambulatory Visit: Payer: Self-pay | Admitting: Family Medicine

## 2019-09-29 DIAGNOSIS — I10 Essential (primary) hypertension: Secondary | ICD-10-CM

## 2019-10-13 ENCOUNTER — Encounter: Payer: Medicare Other | Admitting: Gastroenterology

## 2019-10-30 ENCOUNTER — Ambulatory Visit (AMBULATORY_SURGERY_CENTER): Payer: Self-pay | Admitting: *Deleted

## 2019-10-30 ENCOUNTER — Other Ambulatory Visit: Payer: Self-pay

## 2019-10-30 VITALS — Temp 96.8°F | Wt 140.0 lb

## 2019-10-30 DIAGNOSIS — Z1211 Encounter for screening for malignant neoplasm of colon: Secondary | ICD-10-CM

## 2019-10-30 DIAGNOSIS — Z01818 Encounter for other preprocedural examination: Secondary | ICD-10-CM

## 2019-10-30 MED ORDER — SUPREP BOWEL PREP KIT 17.5-3.13-1.6 GM/177ML PO SOLN: 1.0000 | Freq: Once | ORAL | 0 refills | Status: AC

## 2019-10-30 NOTE — Progress Notes (Signed)
No egg or soy allergy known to patient  No issues with past sedation with any surgeries  or procedures, no intubation problems  No diet pills per patient No home 02 use per patient  No blood thinners per patient  Pt denies issues with constipation  No A fib or A flutter  EMMI video sent to pt's e mail  Pt asked in PV about no sedation- explained reasons for sedation today   Due to the COVID-19 pandemic we are asking patients to follow these guidelines. Please only bring one care partner. Please be aware that your care partner may wait in the car in the parking lot or if they feel like they will be too hot to wait in the car, they may wait in the lobby on the 4th floor. All care partners are required to wear a mask the entire time (we do not have any that we can provide them), they need to practice social distancing, and we will do a Covid check for all patient's and care partners when you arrive. Also we will check their temperature and your temperature. If the care partner waits in their car they need to stay in the parking lot the entire time and we will call them on their cell phone when the patient is ready for discharge so they can bring the car to the front of the building. Also all patient's will need to wear a mask into building.

## 2019-10-31 ENCOUNTER — Other Ambulatory Visit: Payer: Self-pay | Admitting: Family Medicine

## 2019-10-31 DIAGNOSIS — I10 Essential (primary) hypertension: Secondary | ICD-10-CM

## 2019-11-13 ENCOUNTER — Encounter: Payer: Medicare Other | Admitting: Gastroenterology

## 2019-11-17 ENCOUNTER — Ambulatory Visit (INDEPENDENT_AMBULATORY_CARE_PROVIDER_SITE_OTHER): Payer: Medicare Other

## 2019-11-17 ENCOUNTER — Other Ambulatory Visit: Payer: Self-pay | Admitting: Gastroenterology

## 2019-11-17 ENCOUNTER — Encounter: Payer: Self-pay | Admitting: Gastroenterology

## 2019-11-17 DIAGNOSIS — Z1159 Encounter for screening for other viral diseases: Secondary | ICD-10-CM

## 2019-11-18 LAB — SARS CORONAVIRUS 2 (TAT 6-24 HRS): SARS Coronavirus 2: NEGATIVE

## 2019-11-20 ENCOUNTER — Encounter: Payer: Medicare Other | Admitting: Gastroenterology

## 2019-11-30 ENCOUNTER — Other Ambulatory Visit: Payer: Self-pay | Admitting: Family Medicine

## 2019-11-30 DIAGNOSIS — I10 Essential (primary) hypertension: Secondary | ICD-10-CM

## 2019-12-20 ENCOUNTER — Other Ambulatory Visit: Payer: Self-pay | Admitting: Family Medicine

## 2019-12-22 ENCOUNTER — Ambulatory Visit (INDEPENDENT_AMBULATORY_CARE_PROVIDER_SITE_OTHER): Payer: Medicare Other

## 2019-12-22 ENCOUNTER — Other Ambulatory Visit: Payer: Self-pay | Admitting: Gastroenterology

## 2019-12-22 ENCOUNTER — Other Ambulatory Visit: Payer: Self-pay

## 2019-12-22 DIAGNOSIS — Z1159 Encounter for screening for other viral diseases: Secondary | ICD-10-CM

## 2019-12-23 LAB — SARS CORONAVIRUS 2 (TAT 6-24 HRS): SARS Coronavirus 2: NEGATIVE

## 2019-12-24 ENCOUNTER — Ambulatory Visit (AMBULATORY_SURGERY_CENTER): Payer: Medicare Other | Admitting: Gastroenterology

## 2019-12-24 ENCOUNTER — Other Ambulatory Visit: Payer: Self-pay

## 2019-12-24 ENCOUNTER — Encounter: Payer: Self-pay | Admitting: Gastroenterology

## 2019-12-24 VITALS — BP 115/67 | HR 72 | Temp 97.1°F | Resp 15 | Ht 62.0 in | Wt 140.0 lb

## 2019-12-24 DIAGNOSIS — D122 Benign neoplasm of ascending colon: Secondary | ICD-10-CM

## 2019-12-24 DIAGNOSIS — Z1211 Encounter for screening for malignant neoplasm of colon: Secondary | ICD-10-CM

## 2019-12-24 DIAGNOSIS — D125 Benign neoplasm of sigmoid colon: Secondary | ICD-10-CM

## 2019-12-24 DIAGNOSIS — D123 Benign neoplasm of transverse colon: Secondary | ICD-10-CM

## 2019-12-24 MED ORDER — SODIUM CHLORIDE 0.9 % IV SOLN: 500.0000 mL | Freq: Once | INTRAVENOUS | Status: DC

## 2019-12-24 NOTE — Progress Notes (Signed)
Pt's states no medical or surgical changes since previsit or office visit. 

## 2019-12-24 NOTE — Patient Instructions (Signed)
 Handouts on polyps and hemorrhoids given to you today  Await pathology results on polyps removed    YOU HAD AN ENDOSCOPIC PROCEDURE TODAY AT THE [facility]:   Refer to the procedure report that was given to you for any specific questions about what was found during the examination.  If the procedure report does not answer your questions, please call your gastroenterologist to clarify.  If you requested that your care partner not be given the details of your procedure findings, then the procedure report has been included in a sealed envelope for you to review at your convenience later.  YOU SHOULD EXPECT: Some feelings of bloating in the abdomen. Passage of more gas than usual.  Walking can help get rid of the air that was put into your GI tract during the procedure and reduce the bloating. If you had a lower endoscopy (such as a colonoscopy or flexible sigmoidoscopy) you may notice spotting of blood in your stool or on the toilet paper. If you underwent a bowel prep for your procedure, you may not have a normal bowel movement for a few days.  Please Note:  You might notice some irritation and congestion in your nose or some drainage.  This is from the oxygen used during your procedure.  There is no need for concern and it should clear up in a day or so.  SYMPTOMS TO REPORT IMMEDIATELY:  Following lower endoscopy (colonoscopy or flexible sigmoidoscopy):  Excessive amounts of blood in the stool  Significant tenderness or worsening of abdominal pains  Swelling of the abdomen that is new, acute  Fever of 100F or higher   For urgent or emergent issues, a gastroenterologist can be reached at any hour by calling [phone]. Do not use MyChart messaging for urgent concerns.    DIET:  We do recommend a small meal at first, but then you may proceed to your regular diet.  Drink plenty of fluids but you should avoid alcoholic beverages for 24 hours.  ACTIVITY:  You should plan to  take it easy for the rest of today and you should NOT DRIVE or use heavy machinery until tomorrow (because of the sedation medicines used during the test).    FOLLOW UP: Our staff will call the number listed on your records 48-72 hours following your procedure to check on you and address any questions or concerns that you may have regarding the information given to you following your procedure. If we do not reach you, we will leave a message.  We will attempt to reach you two times.  During this call, we will ask if you have developed any symptoms of COVID 19. If you develop any symptoms (ie: fever, flu-like symptoms, shortness of breath, cough etc.) before then, please call [phone].  If you test positive for Covid 19 in the 2 weeks post procedure, please call and report this information to us.    If any biopsies were taken you will be contacted by phone or by letter within the next 1-3 weeks.  Please call us at [phone] if you have not heard about the biopsies in 3 weeks.    SIGNATURES/CONFIDENTIALITY: You and/or your care partner have signed paperwork which will be entered into your electronic medical record.  These signatures attest to the fact that that the information above on your After Visit Summary has been reviewed and is understood.  Full responsibility of the confidentiality of this discharge information lies with you and/or your   care-partner.  

## 2019-12-24 NOTE — Progress Notes (Signed)
Temperature taken by L.C., VS taken by D.T. 

## 2019-12-24 NOTE — Progress Notes (Signed)
To PACU, VSS. Report to [email or web address] 

## 2019-12-24 NOTE — Progress Notes (Signed)
Called to room to assist during endoscopic procedure.  Patient ID and intended procedure confirmed with present staff. Received instructions for my participation in the procedure from the performing physician.  

## 2019-12-24 NOTE — Op Note (Signed)
[facility] Patient Name: Deborah [name] Procedure Date: 12/24/2019 9:16 AM MRN: [id number] Endoscopist: Mauri Pole , MD Age: 64 Referring MD:  Date of Birth: [birth year] Gender: Female Account #: [id number] Procedure:                Colonoscopy Indications:              Screening for colorectal malignant neoplasm Medicines:                Monitored Anesthesia Care Procedure:                Pre-Anesthesia Assessment:                           - Prior to the procedure, a History and Physical                            was performed, and patient medications and                            allergies were reviewed. The patient's tolerance of                            previous anesthesia was also reviewed. The risks                            and benefits of the procedure and the sedation                            options and risks were discussed with the patient.                            All questions were answered, and informed consent                            was obtained. Prior Anticoagulants: The patient has                            taken no previous anticoagulant or antiplatelet                            agents. ASA Grade Assessment: II - A patient with                            mild systemic disease. After reviewing the risks                            and benefits, the patient was deemed in                            satisfactory condition to undergo the procedure.                           After obtaining informed consent, the colonoscope  was passed under direct vision. Throughout the                            procedure, the patient's blood pressure, pulse, and                            oxygen saturations were monitored continuously. The                            Colonoscope was introduced through the anus and                            advanced to the the cecum, identified by                            appendiceal  orifice and ileocecal valve. The                            colonoscopy was performed without difficulty. The                            patient tolerated the procedure well. The quality                            of the bowel preparation was excellent. The                            terminal ileum, ileocecal valve, appendiceal                            orifice, and rectum were photographed. Scope In: 9:23:23 AM Scope Out: 9:40:54 AM Scope Withdrawal Time: 0 hours 11 minutes 53 seconds  Total Procedure Duration: 0 hours 17 minutes 31 seconds  Findings:                 The perianal and digital rectal examinations were                            normal.                           Three sessile polyps were found in the sigmoid                            colon, transverse colon and ascending colon. The                            polyps were 3 to 10 mm in size. These polyps were                            removed with a cold snare. Resection was complete,                            but the polyp tissue was only partially retrieved.  Non-bleeding internal hemorrhoids were found during                            retroflexion. The hemorrhoids were small.                           The exam was otherwise without abnormality. Complications:            No immediate complications. Estimated Blood Loss:     Estimated blood loss was minimal. Impression:               - Three 3 to 10 mm polyps in the sigmoid colon, in                            the transverse colon and in the ascending colon,                            removed with a cold snare. Complete resection.                            Partial retrieval.                           - Non-bleeding internal hemorrhoids.                           - The examination was otherwise normal. Recommendation:           - Patient has a contact number available for                            emergencies. The signs and symptoms of  potential                            delayed complications were discussed with the                            patient. Return to normal activities tomorrow.                            Written discharge instructions were provided to the                            patient.                           - Resume previous diet.                           - Continue present medications.                           - Await pathology results.                           - Repeat colonoscopy in 3 - 5 years for  surveillance based on pathology results. Mauri Pole, MD 12/24/2019 9:44:52 AM This report has been signed electronically.

## 2019-12-26 ENCOUNTER — Telehealth: Payer: Self-pay | Admitting: *Deleted

## 2019-12-26 NOTE — Telephone Encounter (Signed)
1. Have you developed a fever since your procedure? no  2.   Have you had an respiratory symptoms (SOB or cough) since your procedure? no  3.   Have you tested positive for COVID 19 since your procedure no  4.   Have you had any family members/close contacts diagnosed with the COVID 19 since your procedure?  no   If yes to any of these questions please route to Joylene John, RN and Erenest Rasher, RN Follow up Call-  Call back number 12/24/2019  Post procedure Call Back phone  # [phone]  Permission to leave phone message Yes     Patient questions:  Do you have a fever, pain , or abdominal swelling? No. Pain Score  0 *  Have you tolerated food without any problems? Yes.    Have you been able to return to your normal activities? Yes.    Do you have any questions about your discharge instructions: Diet   No. Medications  No. Follow up visit  No.  Do you have questions or concerns about your Care? No.  Actions: * If pain score is 4 or above: No action needed, pain <4.

## 2020-01-04 NOTE — Progress Notes (Signed)
Subjective:  CC -- Annual Physical; With complaints of none  Pt reports she is overall doing well   Hypertension: - Medications: ASA, HCTZ 25mg ,  - Compliance: good  - Checking BP at home: yes, mostly normotenive, since November have had 3 hypertensive readings  - Denies any SOB, CP, vision changes, LE edema, medication SEs, or symptoms of hypotension - Diet: no salt, vegetables, eats healthy - Exercise: can't walk 2/2 back pain; does walk around the house. Reports pain is chronic and has been evaluated by neurosurgery for cervical stenosis. Was told she would eventually need surgical fixation but is refusing at this time.   Cardiovascular: - Risk as of 01/05/20(date):  The 10-year ASCVD risk score Mikey Bussing DC Jr., et al., 2013) is: 10.9%   Values used to calculate the score:     Age: 64 years     Sex: Female     Is Non-Hispanic African American: Yes     Diabetic: No     Tobacco smoker: No     Systolic Blood Pressure: 0000000 mmHg     Is BP treated: Yes     HDL Cholesterol: 44 mg/dL     Total Cholesterol: 240 mg/dL - Dx Hypertension: yes  - Dx Hyperlipidemia: yes  - Dx Obesity: no  - Physical Activity: no  - Diabetes: no    Cancer: Colorectal >> Colonoscopy: yes, polyps noted but benign; will need repeat in 3 years  Lung >> Tobacco Use: no    - If so, previous Low-Dose CT screen: N/A  Breast >> Mammogram: due; orders placed  Cervical/Endometrial >>  - Postmenopausal: yes  - Vaginal Bleeding: no - Pap Smear: UTD   - Previous Abnormal Pap: no   Skin >> Suspicious lesions: no   Social: Alcohol Use: no  Tobacco Use: no   - Interested in Quitting: N/A  Other Drugs: no  Risky Sexual Behavior: no  Depression: no   - PHQ9 score: 0  Depression screen [facility] 2/9 01/05/2020 08/17/2017 07/27/2017  Decreased Interest 0 0 0  Down, Depressed, Hopeless 0 0 0  PHQ - 2 Score 0 0 0  Altered sleeping 0 - -  Tired, decreased energy 0 - -  Change in appetite 0 - -  Feeling bad or failure  about yourself  0 - -  Trouble concentrating 0 - -  Moving slowly or fidgety/restless 0 - -  Suicidal thoughts 0 - -  PHQ-9 Score 0 - -  Support and Life at Home: yes   Other: Osteoporosis: no  Zoster Vaccine: will go to drug store; recently got her COVID vaccine so will like to wait for zoster vaccine Flu Vaccine: UTD  Pneumonia Vaccine: no TDAP: will go to drug store; recently got her COVID vaccine so would like to wait for TDAP  Past Medical History Patient Active Problem List   Diagnosis Date Noted  . Snoring 08/30/2017  . Hypertriglyceridemia 08/20/2017  . Screening for breast cancer 08/20/2017  . Lumbar radiculopathy 08/17/2017  . Essential hypertension 07/27/2017  . Cervical spinal stenosis 06/26/2017  . Cervical radiculopathy 05/10/2017    Medications- reviewed and updated Current Outpatient Medications  Medication Sig Dispense Refill  . Ascorbic Acid (VITAMIN C) 1000 MG tablet Take 1,000 mg by mouth daily.    Marland Kitchen atorvastatin (LIPITOR) 40 MG tablet TAKE 1 TABLET(40 MG) BY MOUTH DAILY 90 tablet 1  . cholecalciferol (VITAMIN D3) 25 MCG (1000 UNIT) tablet Take 1,000 Units by mouth daily.    Marland Kitchen  co-enzyme Q-10 30 MG capsule Take 30 mg by mouth 3 (three) times daily.    . Cyanocobalamin (VITAMIN B 12 PO) Take by mouth.    . hydrochlorothiazide (HYDRODIURIL) 25 MG tablet Take 1 tablet (25 mg total) by mouth daily. 90 tablet 1  . Ibuprofen (ADVIL PO) Take by mouth.    . Magnesium 400 MG CAPS Take by mouth.    . vitamin E (VITAMIN E) 180 MG (400 UNITS) capsule Take 400 Units by mouth daily.     No current facility-administered medications for this visit.    Objective: BP 128/80   Pulse 67   Ht 5\' 2"  (1.575 m)   Wt 141 lb 6.4 oz (64.1 kg)   SpO2 99%   BMI 25.86 kg/m  Gen: NAD, alert, cooperative with exam HEENT: NCAT, EOMI, PERRL CV: RRR, good S1/S2, no murmur Resp: CTABL, no wheezes, non-labored Abd: Soft, Non Tender, Non Distended, BS present, no guarding or  organomegaly Genital Exam: not done Ext: No edema, warm Neuro: Alert and oriented, No gross deficits   Assessment/Plan:  Essential hypertension Normotensive.  We will plan to continue current regimen of hydrochlorothiazide.  Follow-up in 3 to 6 months.  Aspirin discontinued previously due to primary prevention.  Risks discussed with patient and shared decision making to stop this medication.  Will obtain BMP today.  Hypertriglyceridemia Lipid panel obtained today.  Patient has requested this.  Screening for breast cancer Mammogram order placed.  Patient would like to schedule this in May  Cervical spinal stenosis Discussed follow-up with neurosurgery.  She would like to defer this at this time but will call if she desires referral.  Discussed exercising and pools to help alleviate pain   Orders Placed This Encounter  Procedures  . MM DIGITAL SCREENING BILATERAL    Standing Status:   Future    Standing Expiration Date:   03/06/2021    Order Specific Question:   Reason for Exam (SYMPTOM  OR DIAGNOSIS REQUIRED)    Answer:   screen breast cancer    Order Specific Question:   Preferred imaging location?    Answer:   [facility]  . Lipid Panel  . Basic Metabolic Panel    No orders of the defined types were placed in this encounter.    Caroline More, DO, PGY-3 01/05/2020 5:06 PM

## 2020-01-05 ENCOUNTER — Ambulatory Visit (INDEPENDENT_AMBULATORY_CARE_PROVIDER_SITE_OTHER): Payer: Medicare Other | Admitting: Family Medicine

## 2020-01-05 ENCOUNTER — Encounter: Payer: Self-pay | Admitting: Gastroenterology

## 2020-01-05 ENCOUNTER — Other Ambulatory Visit: Payer: Self-pay

## 2020-01-05 ENCOUNTER — Encounter: Payer: Self-pay | Admitting: Family Medicine

## 2020-01-05 VITALS — BP 128/80 | HR 67 | Ht 62.0 in | Wt 141.4 lb

## 2020-01-05 DIAGNOSIS — Z0000 Encounter for general adult medical examination without abnormal findings: Secondary | ICD-10-CM

## 2020-01-05 DIAGNOSIS — Z1231 Encounter for screening mammogram for malignant neoplasm of breast: Secondary | ICD-10-CM | POA: Diagnosis not present

## 2020-01-05 DIAGNOSIS — I10 Essential (primary) hypertension: Secondary | ICD-10-CM | POA: Diagnosis not present

## 2020-01-05 DIAGNOSIS — E785 Hyperlipidemia, unspecified: Secondary | ICD-10-CM

## 2020-01-05 DIAGNOSIS — M4802 Spinal stenosis, cervical region: Secondary | ICD-10-CM

## 2020-01-05 DIAGNOSIS — E781 Pure hyperglyceridemia: Secondary | ICD-10-CM | POA: Diagnosis not present

## 2020-01-05 DIAGNOSIS — M5412 Radiculopathy, cervical region: Secondary | ICD-10-CM

## 2020-01-05 NOTE — Assessment & Plan Note (Signed)
Lipid panel obtained today.  Patient has requested this.

## 2020-01-05 NOTE — Assessment & Plan Note (Signed)
Mammogram order placed.  Patient would like to schedule this in May

## 2020-01-05 NOTE — Assessment & Plan Note (Signed)
Normotensive.  We will plan to continue current regimen of hydrochlorothiazide.  Follow-up in 3 to 6 months.  Aspirin discontinued previously due to primary prevention.  Risks discussed with patient and shared decision making to stop this medication.  Will obtain BMP today.

## 2020-01-05 NOTE — Assessment & Plan Note (Signed)
Discussed follow-up with neurosurgery.  She would like to defer this at this time but will call if she desires referral.  Discussed exercising and pools to help alleviate pain

## 2020-01-05 NOTE — Patient Instructions (Signed)
Healthy Eating Following a healthy eating pattern may help you to achieve and maintain a healthy body weight, reduce the risk of chronic disease, and live a long and productive life. It is important to follow a healthy eating pattern at an appropriate calorie level for your body. Your nutritional needs should be met primarily through food by choosing a variety of nutrient-rich foods. What are tips for following this plan? Reading food labels  Read labels and choose the following: ? Reduced or low sodium. ? Juices with 100% fruit juice. ? Foods with low saturated fats and high polyunsaturated and monounsaturated fats. ? Foods with whole grains, such as whole wheat, cracked wheat, brown rice, and wild rice. ? Whole grains that are fortified with folic acid. This is recommended for women who are pregnant or who want to become pregnant.  Read labels and avoid the following: ? Foods with a lot of added sugars. These include foods that contain brown sugar, corn sweetener, corn syrup, dextrose, fructose, glucose, high-fructose corn syrup, honey, invert sugar, lactose, malt syrup, maltose, molasses, raw sugar, sucrose, trehalose, or turbinado sugar.  Do not eat more than the following amounts of added sugar per day:  6 teaspoons (25 g) for women.  9 teaspoons (38 g) for men. ? Foods that contain processed or refined starches and grains. ? Refined grain products, such as white flour, degermed cornmeal, white bread, and white rice. Shopping  Choose nutrient-rich snacks, such as vegetables, whole fruits, and nuts. Avoid high-calorie and high-sugar snacks, such as potato chips, fruit snacks, and candy.  Use oil-based dressings and spreads on foods instead of solid fats such as butter, stick margarine, or cream cheese.  Limit pre-made sauces, mixes, and "instant" products such as flavored rice, instant noodles, and ready-made pasta.  Try more plant-protein sources, such as tofu, tempeh, black beans,  edamame, lentils, nuts, and seeds.  Explore eating plans such as the Mediterranean diet or vegetarian diet. Cooking  Use oil to saut or stir-fry foods instead of solid fats such as butter, stick margarine, or lard.  Try baking, boiling, grilling, or broiling instead of frying.  Remove the fatty part of meats before cooking.  Steam vegetables in water or broth. Meal planning   At meals, imagine dividing your plate into fourths: ? One-half of your plate is fruits and vegetables. ? One-fourth of your plate is whole grains. ? One-fourth of your plate is protein, especially lean meats, poultry, eggs, tofu, beans, or nuts.  Include low-fat dairy as part of your daily diet. Lifestyle  Choose healthy options in all settings, including home, work, school, restaurants, or stores.  Prepare your food safely: ? Wash your hands after handling raw meats. ? Keep food preparation surfaces clean by regularly washing with hot, soapy water. ? Keep raw meats separate from ready-to-eat foods, such as fruits and vegetables. ? Cook seafood, meat, poultry, and eggs to the recommended internal temperature. ? Store foods at safe temperatures. In general:  Keep cold foods at 59F (4.4C) or below.  Keep hot foods at 159F (60C) or above.  Keep your freezer at [facility] (-17.8C) or below.  Foods are no longer safe to eat when they have been between the temperatures of 40-159F (4.4-60C) for more than 2 hours. What foods should I eat? Fruits Aim to eat 2 cup-equivalents of fresh, canned (in natural juice), or frozen fruits each day. Examples of 1 cup-equivalent of fruit include 1 small apple, 8 large strawberries, 1 cup canned fruit,  cup  dried fruit, or 1 cup 100% juice. Vegetables Aim to eat 2-3 cup-equivalents of fresh and frozen vegetables each day, including different varieties and colors. Examples of 1 cup-equivalent of vegetables include 2 medium carrots, 2 cups raw, leafy greens, 1 cup chopped  vegetable (raw or cooked), or 1 medium baked potato. Grains Aim to eat 6 ounce-equivalents of whole grains each day. Examples of 1 ounce-equivalent of grains include 1 slice of bread, 1 cup ready-to-eat cereal, 3 cups popcorn, or  cup cooked rice, pasta, or cereal. Meats and other proteins Aim to eat 5-6 ounce-equivalents of protein each day. Examples of 1 ounce-equivalent of protein include 1 egg, 1/2 cup nuts or seeds, or 1 tablespoon (16 g) peanut butter. A cut of meat or fish that is the size of a deck of cards is about 3-4 ounce-equivalents.  Of the protein you eat each week, try to have at least 8 ounces come from seafood. This includes salmon, trout, herring, and anchovies. Dairy Aim to eat 3 cup-equivalents of fat-free or low-fat dairy each day. Examples of 1 cup-equivalent of dairy include 1 cup (240 mL) milk, 8 ounces (250 g) yogurt, 1 ounces (44 g) natural cheese, or 1 cup (240 mL) fortified soy milk. Fats and oils  Aim for about 5 teaspoons (21 g) per day. Choose monounsaturated fats, such as canola and olive oils, avocados, peanut butter, and most nuts, or polyunsaturated fats, such as sunflower, corn, and soybean oils, walnuts, pine nuts, sesame seeds, sunflower seeds, and flaxseed. Beverages  Aim for six 8-oz glasses of water per day. Limit coffee to three to five 8-oz cups per day.  Limit caffeinated beverages that have added calories, such as soda and energy drinks.  Limit alcohol intake to no more than 1 drink a day for nonpregnant women and 2 drinks a day for men. One drink equals 12 oz of beer (355 mL), 5 oz of wine (148 mL), or 1 oz of hard liquor (44 mL). Seasoning and other foods  Avoid adding excess amounts of salt to your foods. Try flavoring foods with herbs and spices instead of salt.  Avoid adding sugar to foods.  Try using oil-based dressings, sauces, and spreads instead of solid fats. This information is based on general [place] nutrition guidelines. For more  information, visit [employer]. Exact amounts may vary based on your nutrition needs. Summary  A healthy eating plan may help you to maintain a healthy weight, reduce the risk of chronic diseases, and stay active throughout your life.  Plan your meals. Make sure you eat the right portions of a variety of nutrient-rich foods.  Try baking, boiling, grilling, or broiling instead of frying.  Choose healthy options in all settings, including home, work, school, restaurants, or stores. This information is not intended to replace advice given to you by your health care provider. Make sure you discuss any questions you have with your health care provider. Document Revised: 12/31/2017 Document Reviewed: 12/31/2017 Elsevier Patient Education  [place].

## 2020-01-05 NOTE — Assessment & Plan Note (Deleted)
Discussed follow-up with neurosurgery.  She would like to defer this at this time but will call if she desires referral.  Discussed exercising and pools to help alleviate pain

## 2020-01-06 ENCOUNTER — Other Ambulatory Visit: Payer: Self-pay | Admitting: Family Medicine

## 2020-01-06 ENCOUNTER — Ambulatory Visit
Admission: RE | Admit: 2020-01-06 | Discharge: 2020-01-06 | Disposition: A | Discharge status: Home / Self Care | Payer: Medicare Other | Source: Ambulatory Visit | Attending: Family Medicine | Admitting: Family Medicine

## 2020-01-06 DIAGNOSIS — Z1231 Encounter for screening mammogram for malignant neoplasm of breast: Secondary | ICD-10-CM

## 2020-01-06 LAB — BASIC METABOLIC PANEL
BUN/Creatinine Ratio: 19 (ref 12–28)
BUN: 13 mg/dL (ref 8–27)
CO2: 28 mmol/L (ref 20–29)
Calcium: 10.1 mg/dL (ref 8.7–10.3)
Chloride: 100 mmol/L (ref 96–106)
Creatinine, Ser: 0.7 mg/dL (ref 0.57–1.00)
GFR calc Af Amer: 106 mL/min/{1.73_m2} (ref >59)
GFR calc non Af Amer: 92 mL/min/{1.73_m2} (ref >59)
Glucose: 86 mg/dL (ref 65–99)
Potassium: 3.9 mmol/L (ref 3.5–5.2)
Sodium: 141 mmol/L (ref 134–144)

## 2020-01-06 LAB — LIPID PANEL
Chol/HDL Ratio: 3 ratio (ref 0.0–4.4)
Cholesterol, Total: 152 mg/dL (ref 100–199)
HDL: 50 mg/dL (ref >39)
LDL Chol Calc (NIH): 81 mg/dL (ref 0–99)
Triglycerides: 114 mg/dL (ref 0–149)
VLDL Cholesterol Cal: 21 mg/dL (ref 5–40)

## 2020-04-07 ENCOUNTER — Telehealth: Payer: Self-pay

## 2020-04-07 ENCOUNTER — Encounter: Payer: Self-pay | Admitting: Family Medicine

## 2020-04-07 ENCOUNTER — Ambulatory Visit (INDEPENDENT_AMBULATORY_CARE_PROVIDER_SITE_OTHER): Payer: Medicare Other | Admitting: Family Medicine

## 2020-04-07 ENCOUNTER — Other Ambulatory Visit: Payer: Self-pay

## 2020-04-07 VITALS — BP 122/76 | HR 82 | Wt 144.2 lb

## 2020-04-07 DIAGNOSIS — R0600 Dyspnea, unspecified: Secondary | ICD-10-CM | POA: Diagnosis not present

## 2020-04-07 DIAGNOSIS — G8929 Other chronic pain: Secondary | ICD-10-CM

## 2020-04-07 DIAGNOSIS — M549 Dorsalgia, unspecified: Secondary | ICD-10-CM

## 2020-04-07 DIAGNOSIS — M546 Pain in thoracic spine: Secondary | ICD-10-CM | POA: Diagnosis not present

## 2020-04-07 DIAGNOSIS — R0609 Other forms of dyspnea: Secondary | ICD-10-CM

## 2020-04-07 NOTE — Progress Notes (Signed)
    SUBJECTIVE:   CHIEF COMPLAINT / HPI:   Back pain Patient is a very pleasant female presents today to discuss her back pain.  She states her back pain has been going on for months and has a history of cervical stenosis and cervical radiculopathy.  She states that this back pain is located in the upper posterior shoulder and sometimes occurs a sharp pain when she sneezes and feels like a catching muscle.  She states that sometimes this pain is worse than others and feels deep in the muscle.  Dyspnea Patient states that over the past 3 years she is slowly becoming more short of breath when walking.  She states she used to she would exercise on a regular basis but that she now finds that she gets winded even when walking across the living room.  She states that because of this she stopped exercising.  She states that when this first started she had a cardiac work-up which was negative but feels that she has been slowly getting more and more short of breath.  PERTINENT  PMH / PSH: History of cervical and lumbar radiculopathy  OBJECTIVE:   BP 122/76   Pulse 82   Wt 144 lb 3.2 oz (65.4 kg)   SpO2 97%   BMI 26.37 kg/m    General: NAD, pleasant, able to participate in exam Cardiac: RRR, no murmurs. Respiratory: CTAB, normal effort, No wheezes, rales or rhonchi Extremities: no edema Skin: warm and dry, no rashes noted  ASSESSMENT/PLAN:   Right-sided thoracic back pain Patient with several week history of right-sided upper thoracic pain with a history of cervical stenosis and cervical radiculopathy. Plan: -Provide physical therapy referral to assist with patient's muscular pain providing flexibility and strength training. -Recommend follow-up in a few weeks to reevaluate how her progress has improved. -Physical therapy is not of assistance can consider further imaging  Dyspnea Assessment: Patient with a history of dyspnea on exertion which is slowly been worsening over the past 3  years.  Patient denies chest pain but states that now she is no longer able to exercise if she gets too short of breath when trying to do so. Plan: -Echocardiogram ordered -Recommend follow-up up appointment in the next 2-3 weeks, at which point can consider CBC, EKG, ambulatory pulse ox     Lurline Del, [place]

## 2020-04-07 NOTE — Telephone Encounter (Signed)
Attempted to call to schedule ECHO at [facility]. After multiple attempts, LVM for returned call to clinic to schedule patient. Patient requests scheduling for next available appointment. No preference for morning or afternoon. Will return call to patient at [phone] when scheduling is complete.   Talbot Grumbling, RN

## 2020-04-07 NOTE — Assessment & Plan Note (Signed)
Assessment: Patient with a history of dyspnea on exertion which is slowly been worsening over the past 3 years.  Patient denies chest pain but states that now she is no longer able to exercise if she gets too short of breath when trying to do so. Plan: -Echocardiogram ordered -Recommend follow-up up appointment in the next 2-3 weeks, at which point can consider CBC, EKG, ambulatory pulse ox

## 2020-04-07 NOTE — Assessment & Plan Note (Signed)
Patient with several week history of right-sided upper thoracic pain with a history of cervical stenosis and cervical radiculopathy. Plan: -Provide physical therapy referral to assist with patient's muscular pain providing flexibility and strength training. -Recommend follow-up in a few weeks to reevaluate how her progress has improved. -Physical therapy is not of assistance can consider further imaging

## 2020-04-07 NOTE — Telephone Encounter (Signed)
Patient scheduled for tomorrow morning at 1115. Patient called and informed.   Talbot Grumbling, RN

## 2020-04-07 NOTE — Patient Instructions (Addendum)
It was great to see you!  Our plans for today:  -I am sending a referral for physical therapy for your left-sided back pain.  I believe this pain is most likely related to your cervical radiculopathy but that some physical therapy may help improve the strength in provide some reduction in pain. -For your increased shortness of breath over the past few months I am ordering an echocardiogram, an ultrasound of your heart.  We will call you with the appointment time for the echocardiogram once it is made. -If you develop any chest pain, pain shooting down your left arm, worsening shortness of breath, or other concerning symptoms you should immediately call 911 and seek care at the emergency department.  Take care and seek immediate care sooner if you develop any concerns.   Dr. [facility]

## 2020-04-08 ENCOUNTER — Ambulatory Visit (HOSPITAL_COMMUNITY)
Admission: RE | Admit: 2020-04-08 | Discharge: 2020-04-08 | Disposition: A | Discharge status: Home / Self Care | Payer: Medicare Other | Source: Ambulatory Visit | Attending: Family Medicine | Admitting: Family Medicine

## 2020-04-08 DIAGNOSIS — I10 Essential (primary) hypertension: Secondary | ICD-10-CM | POA: Insufficient documentation

## 2020-04-08 DIAGNOSIS — R0609 Other forms of dyspnea: Secondary | ICD-10-CM

## 2020-04-08 DIAGNOSIS — R0600 Dyspnea, unspecified: Secondary | ICD-10-CM | POA: Diagnosis not present

## 2020-04-08 NOTE — Progress Notes (Signed)
  Echocardiogram 2D Echocardiogram has been performed.  Jennette Dubin 04/08/2020, 11:50 AM

## 2020-04-09 ENCOUNTER — Telehealth: Payer: Self-pay | Admitting: *Deleted

## 2020-04-09 NOTE — Telephone Encounter (Signed)
Pt calls requesting results of her ECHO.  She sees them in [place] but would like an explanation. Christen Bame, CMA

## 2020-04-09 NOTE — Telephone Encounter (Signed)
Attempted to call patient with no answer. Will re-attempt later

## 2020-04-12 NOTE — Telephone Encounter (Signed)
Returned patient's call.  Discussed the patient's echo results and provided recommendations I would like for her to come back in for a follow-up appointment to further discuss her dyspnea on exertion.  At this point we will likely consider an EKG, particularly if she is symptomatic to look for signs of arrhythmia.  Patient states she plans to call our clinic and make an appointment that fits with her schedule, likely for later this week

## 2020-04-26 ENCOUNTER — Ambulatory Visit (INDEPENDENT_AMBULATORY_CARE_PROVIDER_SITE_OTHER): Payer: Medicare Other | Admitting: Family Medicine

## 2020-04-26 ENCOUNTER — Encounter: Payer: Self-pay | Admitting: Family Medicine

## 2020-04-26 ENCOUNTER — Other Ambulatory Visit: Payer: Self-pay

## 2020-04-26 DIAGNOSIS — M6289 Other specified disorders of muscle: Secondary | ICD-10-CM | POA: Diagnosis not present

## 2020-04-26 NOTE — Patient Instructions (Signed)
It was great to see you!  Our plans for today:  - I am glad your shortness of breath has improved -For your back fatigue I think this is mostly related to musculoskeletal deconditioning and should improve with physical therapy. -As we discussed we can consider checking labs for generalized fatigue but can do this once you meet with physical therapy -Once you hear from physical therapy I would like for you to make a follow-up appointment at your convenience if they do not provide a good answer or if their exercises did not make a significant difference in your back muscle strength and endurance over the next 2 or 3 months of doing those exercises. -As always do not hesitate to make a follow-up appointment with [place] anytime we can do anything for you.  Take care and seek immediate care sooner if you develop any concerns.   Dr. [facility]

## 2020-04-26 NOTE — Progress Notes (Signed)
    SUBJECTIVE:   CHIEF COMPLAINT / HPI:   Dyspnea Patient is a 64 year old female who presents today for follow-up regarding her dyspnea.  At her previous appointment we scheduled a echocardiogram of her heart which did not show any obvious sign of her dyspnea.  Patient states her dyspnea has resolved and she has no further issues with this.  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  She states she is also work many long hours over the years and would typically work through her back pain.  She states that lately over about the past 2 years or so she has had some increase in fatigue of her back muscles.  She denies generalized fatigue.  States that she has an appointment scheduled with physical therapy in the next 1 or 2 weeks to evaluate her back.  PERTINENT  PMH / PSH: HTN  OBJECTIVE:   BP (!) 114/62   Pulse 85   Ht 5\' 2"  (1.575 m)   Wt 143 lb 4 oz (65 kg)   SpO2 98%   BMI 26.20 kg/m    General: NAD, pleasant, able to participate in exam Respiratory: No respiratory distress MSK: No pain with palpation to the midline spine or musculature lateral to the spine of the upper or lower back. Psych: Normal affect and mood  ASSESSMENT/PLAN:   Muscle fatigue Assessment: Muscular fatigue of the upper back muscles.  Patient denies symptoms of generalized fatigue, currently denies muscle pains but states that she often does have these when trying to be active.  Has had to reduce activities due to recent back pain which may be contributing to reduced muscular endurance of the back muscles.  Less likely generalized fatigue with symptoms seeming to be isolated to the upper back.  Did recommend patient testing for fatigue per patient request hold off until being evaluated by physical therapy which I think is a reasonable option. Plan: -Discussed testing for fatigue, including CBC, TSH, BMP, vitamin  B-12, folate.  Patient request waiting until being assessed by physical therapy prior to doing these as she thinks her fatigue is musculoskeletal and isolated to her upper back. -Per discussion with patient will plan to follow-up with patient after she is evaluated by physical therapy in the next 1-2 weeks Precepted patient with Dr. Garlan Fillers prior to leaving.   Dyspnea: Improved/resolved.  If represents will consider additional work-up.  Lurline Del, [place]

## 2020-04-26 NOTE — Assessment & Plan Note (Signed)
Assessment: Muscular fatigue of the upper back muscles.  Patient denies symptoms of generalized fatigue, currently denies muscle pains but states that she often does have these when trying to be active.  Has had to reduce activities due to recent back pain which may be contributing to reduced muscular endurance of the back muscles.  Less likely generalized fatigue with symptoms seeming to be isolated to the upper back.  Did recommend patient testing for fatigue per patient request hold off until being evaluated by physical therapy which I think is a reasonable option. Plan: -Discussed testing for fatigue, including CBC, TSH, BMP, vitamin B-12, folate.  Patient request waiting until being assessed by physical therapy prior to doing these as she thinks her fatigue is musculoskeletal and isolated to her upper back. -Per discussion with patient will plan to follow-up with patient after she is evaluated by physical therapy in the next 1-2 weeks

## 2020-05-10 ENCOUNTER — Other Ambulatory Visit: Payer: Self-pay

## 2020-05-10 ENCOUNTER — Ambulatory Visit: Payer: Medicare Other | Attending: Family Medicine | Admitting: Physical Therapy

## 2020-05-10 ENCOUNTER — Encounter: Payer: Self-pay | Admitting: Physical Therapy

## 2020-05-10 DIAGNOSIS — M546 Pain in thoracic spine: Secondary | ICD-10-CM | POA: Insufficient documentation

## 2020-05-10 DIAGNOSIS — G8929 Other chronic pain: Secondary | ICD-10-CM | POA: Diagnosis not present

## 2020-05-10 DIAGNOSIS — M545 Low back pain, unspecified: Secondary | ICD-10-CM

## 2020-05-10 DIAGNOSIS — R293 Abnormal posture: Secondary | ICD-10-CM | POA: Diagnosis not present

## 2020-05-10 DIAGNOSIS — M542 Cervicalgia: Secondary | ICD-10-CM | POA: Diagnosis not present

## 2020-05-10 NOTE — Patient Instructions (Signed)
Access Code: [email or web address] by: Shanon Brow CarrollExercises  Standing Upper Trapezius Mobilization with Small Ball - 1 x daily - 7 x weekly - 2 sets - 10 reps

## 2020-05-11 ENCOUNTER — Encounter: Payer: Self-pay | Admitting: Physical Therapy

## 2020-05-11 NOTE — Therapy (Signed)
[place], [place], [address] Phone: [phone]   Fax:  [phone]  Physical Therapy Evaluation  Patient Details  Name: Deborah [name] MRN: [id number] Date of Birth: August 28, 1956 Referring Provider (PT): Dr Dorris Singh   Encounter Date: 05/10/2020   PT End of Session - 05/10/20 1645    Visit Number 1    Number of Visits 6    Date for PT Re-Evaluation 06/21/20    Authorization Type Medicare    PT Start Time 1546    PT Stop Time 1631    PT Time Calculation (min) 45 min    Activity Tolerance Patient tolerated treatment well    Behavior During Therapy [facility] for tasks assessed/performed           Past Medical History:  Diagnosis Date  . Arthritis   . Hypertension     Past Surgical History:  Procedure Laterality Date  . carpel tunnel Right 2005  . CESAREAN SECTION  05/14/1993  . COLONOSCOPY     ~10 yrs ago- normal per pt- in [place] per pt     There were no vitals filed for this visit.    Subjective Assessment - 05/10/20 1549    Subjective Pt is a 64 y.o. c/o back pain. She states her back pain started on the left side. The pain has been going on for years. She has pain in her neck, lower back, and on the left side. She states she does not really do much. She lays around and cannot stay in one position too long. Pt states she uses a wheel chair sometimes because she cannot walk far distances. She walks for exercise, but only around her house. Pt states she gets out of breath very easily.    Limitations Lifting;Sitting;Walking;House hold activities    How long can you stand comfortably? 5 minutes    How long can you walk comfortably? 5 minutes   can only walk around the house   Diagnostic tests nothing recently    Patient Stated Goals decrease pain; be able to walk    Currently in Pain? Yes   no pain today   Pain Score 9    pt reports pain varies; 8-9/10 at its worst   Pain Location Back      Pain Orientation Right;Mid    Pain Descriptors / Indicators Aching    Pain Type Chronic pain    Pain Radiating Towards can radiate into right arm    Pain Onset More than a month ago    Pain Frequency Intermittent    Aggravating Factors  standing, walking, lifting    Pain Relieving Factors lying down    Effect of Pain on Daily Activities not able to walk very far, cannot stay in one position too long              [facility] PT Assessment - 05/11/20 0001      Assessment   Medical Diagnosis Back Pain     Referring Provider (PT) Dr Dorris Singh    Onset Date/Surgical Date --   years ago   Hand Dominance Right    Prior Therapy yes   5 years ago     Precautions   Precautions None      Restrictions   Weight Bearing Restrictions No      Home Environment   Additional Comments stairs at home about 12      Prior Function   Level of Independence Independent  Vocation Unemployed    Vocation Requirements stopped 2 yeatrs ago due to pain      Cognition   Overall Cognitive Status Within Functional Limits for tasks assessed    Attention Focused    Focused Attention Appears intact    Memory Appears intact    Awareness Appears intact    Problem Solving Appears intact      Observation/Other Assessments   Focus on Therapeutic Outcomes (FOTO)  will review next visit       Sensation   Light Touch Appears Intact    Proprioception Appears Intact      Coordination   Gross Motor Movements are Fluid and Coordinated Yes    Fine Motor Movements are Fluid and Coordinated Yes      Posture/Postural Control   Posture Comments rounded shoulders, slouched sitting      AROM   Overall AROM Comments Cervical ROM WNL with no pain    Lumbar Flexion 60     Lumbar Extension 16    Lumbar - Right Rotation WNL    Lumbar - Left Rotation WNL      Strength   Right Shoulder Flexion 4/5    Right Shoulder Internal Rotation 4/5    Right Shoulder External Rotation 4/5    Left Shoulder Flexion 4/5     Left Shoulder Internal Rotation 4/5    Left Shoulder External Rotation 4/5    Right Hip Flexion 4-/5    Right Hip ABduction 4+/5    Right Hip ADduction 4+/5    Left Hip Flexion 4-/5    Left Hip ABduction 4+/5    Left Hip ADduction 4+/5    Right Knee Flexion 4/5    Right Knee Extension 4/5    Left Knee Flexion 4/5    Left Knee Extension 4/5      Palpation   Palpation comment no tenderness to palpation in upper traps, periscapulars, or lumbar paraspinals                      Objective measurements completed on examination: See above findings.       [place] Adult PT Treatment/Exercise - 05/11/20 0001      Neck Exercises: Standing   Other Standing Exercises wall posture and wall reach x10       Neck Exercises: Stretches   Other Neck Stretches tennis ball trigger point release to mid thoracic /scapular area. At this time patient has no pain but she was advised if her pain is musculoskeletal she will have trigger points when she is having acute pain flair-ups                   PT Education - 05/10/20 1739    Education Details HEP, symptom management    Person(s) Educated Patient    Methods Explanation;Demonstration;Tactile cues;Verbal cues;Handout    Comprehension Tactile cues required;Verbal cues required;Returned demonstration;Verbalized understanding            PT Short Term Goals - 05/10/20 1632      PT SHORT TERM GOAL #1   Title Therapy will review FOTO with patient next visit    Time 2    Period Weeks    Status New    Target Date 05/24/20      PT SHORT TERM GOAL #2   Title Patient will be indepdnent with basic exercise program for postural correction    Time 3    Period Weeks    Status New  Target Date 06/01/20      PT SHORT TERM GOAL #3   Title Patient will report a 50% decrease in frequency and intesnity of pain in back and neck    Time 3    Period Weeks    Status New    Target Date 06/01/20             PT Long Term Goals  - 05/11/20 0710      PT LONG TERM GOAL #1   Title Patient will perfrom 1 hour of housework without significant increase in pain    Time 6    Period Weeks    Status New    Target Date 06/22/20      PT LONG TERM GOAL #2   Title Patient will walk 1 mile without significant pain as it is her preffered method of exercising    Time 6    Period Weeks    Status New    Target Date 06/22/20      PT LONG TERM GOAL #3   Title Patient will increase strength in bilateral UE and LE to >4+/5    Time 6    Period Weeks    Status New    Target Date 06/22/20                  Plan - 05/10/20 1645    Clinical Impression Statement Pt is a 64 y.o. female presenting to outpatient PT c/o back pain. She states she cannot localize the pain and the pain can occur in her neck, lower back, and on her left side. She has been having pain for years and has seen many different doctors. Pt is not active, and had to quit work 2 years ago due to pain. Pt's cervical and lumbar ROM was WNL and did not reproduce pain. Pt has general strength deficits in UE and LE . Pt had no TTP in upper traps, periscapular muscles, or lumbar paraspinals. Pt stated no pain with movements during evaluation. Pt exhibited significant thoracic kyphosis with rounded shoulders and forwrad head. Therapy worked on postural exercises to correct posture mechanics. Pt stated she was out of breath  She reports it is common for her to get out of breath with light activity   light postural exercises. Pt would benefit from skilled therapy to improve posture and decrease pain.    Personal Factors and Comorbidities Time since onset of injury/illness/exacerbation    Examination-Activity Limitations Bend;Carry;Lift;Stand;Stairs;Squat;Sit;Reach Overhead    Examination-Participation Restrictions Community Activity;Laundry;Occupation    Stability/Clinical Decision Making Evolving/Moderate complexity    Clinical Decision Making Moderate    Rehab Potential  Fair    PT Frequency 1x / week    PT Duration 4 weeks    PT Treatment/Interventions ADLs/Self Care Home Management;Ultrasound;Traction;Moist Heat;Iontophoresis 4mg /ml Dexamethasone;Functional mobility training;Therapeutic activities;Therapeutic exercise;Manual techniques;Patient/family education;Passive range of motion;Dry needling;Energy conservation;Joint Manipulations;Spinal Manipulations;Vasopneumatic Device;Taping    PT Next Visit Plan assess tolerance to HEP; postural correction and exercises, thoracic mobs    PT Home Exercise Plan Access Code: [email or web address] by: Oren Bracket.Standing Upper Trapezius Mobilization with Small Ball - 1 x daily - 7 x weekly - 2 sets - 10 reps    Consulted and Agree with Plan of Care Patient           Patient will benefit from skilled therapeutic intervention in order to improve the following deficits and impairments:  Decreased range of motion, Decreased endurance, Decreased activity tolerance, Pain, Improper body mechanics, Decreased mobility, Decreased strength,  Postural dysfunction  Visit Diagnosis: Chronic bilateral low back pain without sciatica  Pain in thoracic spine  Cervicalgia  Abnormal posture     Problem List Patient Active Problem List   Diagnosis Date Noted  . Muscle fatigue 04/26/2020  . Right-sided thoracic back pain 04/07/2020  . Dyspnea 04/07/2020  . Snoring 08/30/2017  . Hypertriglyceridemia 08/20/2017  . Screening for breast cancer 08/20/2017  . Lumbar radiculopathy 08/17/2017  . Essential hypertension 07/27/2017  . Cervical spinal stenosis 06/26/2017  . Cervical radiculopathy 05/10/2017    Carney Living PT DPT  05/11/2020, 7:22 AM   Minna Merritts SPT  PT was present and dirrected all treatment   [place], [place], [address] Phone: [phone]   Fax:  [phone]  Name: Aziza Stuckert MRN: [id number] Date of Birth:  1955/10/28

## 2020-05-18 ENCOUNTER — Ambulatory Visit: Payer: Medicare Other | Admitting: Physical Therapy

## 2020-05-25 ENCOUNTER — Ambulatory Visit: Payer: Medicare Other | Admitting: Physical Therapy

## 2020-05-25 ENCOUNTER — Other Ambulatory Visit: Payer: Self-pay

## 2020-05-25 DIAGNOSIS — M545 Low back pain, unspecified: Secondary | ICD-10-CM

## 2020-05-25 DIAGNOSIS — R293 Abnormal posture: Secondary | ICD-10-CM

## 2020-05-25 DIAGNOSIS — M542 Cervicalgia: Secondary | ICD-10-CM

## 2020-05-25 DIAGNOSIS — G8929 Other chronic pain: Secondary | ICD-10-CM

## 2020-05-25 DIAGNOSIS — M546 Pain in thoracic spine: Secondary | ICD-10-CM

## 2020-05-26 NOTE — Therapy (Signed)
[place], [place], [address] Phone: [phone]   Fax:  [phone]  Physical Therapy Treatment  Patient Details  Name: Deborah [name] MRN: [id number] Date of Birth: April 24, 1956 Referring Provider (PT): Dr Dorris Singh   Encounter Date: 05/25/2020   PT End of Session - 05/26/20 0841    Visit Number 2    Number of Visits 6    Date for PT Re-Evaluation 06/21/20    Authorization Type Medicare    PT Start Time 1545    PT Stop Time 1626    PT Time Calculation (min) 41 min    Activity Tolerance Patient tolerated treatment well    Behavior During Therapy [facility] for tasks assessed/performed           Past Medical History:  Diagnosis Date  . Arthritis   . Hypertension     Past Surgical History:  Procedure Laterality Date  . carpel tunnel Right 2005  . CESAREAN SECTION  05/14/1993  . COLONOSCOPY     ~10 yrs ago- normal per pt- in [place] per pt     There were no vitals filed for this visit.                      [place] Adult PT Treatment/Exercise - 05/26/20 0001      Self-Care   Self-Care Other Self-Care Comments    Other Self-Care Comments  reviewed how we plan on progresisng her exercises and stretches       Exercises   Exercises Neck      Neck Exercises: Seated   Other Seated Exercise seated bilateral ER 2x10 red; horizontal abdcution red 2x10; bilateral shoulder flexion 2x10 with stick; scap retraction yellow 2x10;       Manual Therapy   Manual Therapy Joint mobilization;Soft tissue mobilization;Manual Traction    Joint Mobilization PA glides of lower cervical spien and into the thoracic spine     Soft tissue mobilization trigger point release to mid thoracic spine     Manual Traction gentle cervical decompression                     PT Short Term Goals - 05/26/20 0842      PT SHORT TERM GOAL #1   Title Therapy will review FOTO with patient next visit     Time 2    Period Weeks    Status On-going    Target Date 05/24/20      PT SHORT TERM GOAL #2   Title Patient will be indepdnent with basic exercise program for postural correction    Time 3    Period Weeks    Status On-going    Target Date 06/01/20      PT SHORT TERM GOAL #3   Title Patient will report a 50% decrease in frequency and intesnity of pain in back and neck    Time 3    Period Weeks    Status On-going    Target Date 06/01/20             PT Long Term Goals - 05/11/20 0710      PT LONG TERM GOAL #1   Title Patient will perfrom 1 hour of housework without significant increase in pain    Time 6    Period Weeks    Status New    Target Date 06/22/20      PT LONG TERM GOAL #2   Title  Patient will walk 1 mile without significant pain as it is her preffered method of exercising    Time 6    Period Weeks    Status New    Target Date 06/22/20      PT LONG TERM GOAL #3   Title Patient will increase strength in bilateral UE and LE to >4+/5    Time 6    Period Weeks    Status New    Target Date 06/22/20                 Plan - 05/25/20 1737    Clinical Impression Statement Patient has a fear of doing to much at this point. She was assured we will start her off with a low level of activity then progress her into harder exercises. She is concerned about her cardivascular endruance. We will add in light cardio next visit. She was advised that now would be a good time to test out what she can do. If she ends up with pain it is something we can treat. Today she had only small trigger points. Therapy worked on mid thoracic mobility. She had limited mid thoracic mobility cpompared to upper thoracic. Therapy will advance exercises as tolerated.    Personal Factors and Comorbidities Time since onset of injury/illness/exacerbation    Examination-Activity Limitations Bend;Carry;Lift;Stand;Stairs;Squat;Sit;Reach Overhead    Stability/Clinical Decision Making  Evolving/Moderate complexity    Clinical Decision Making Moderate    Rehab Potential Fair    PT Frequency 1x / week    PT Duration 4 weeks    PT Treatment/Interventions ADLs/Self Care Home Management;Ultrasound;Traction;Moist Heat;Iontophoresis 4mg /ml Dexamethasone;Functional mobility training;Therapeutic activities;Therapeutic exercise;Manual techniques;Patient/family education;Passive range of motion;Dry needling;Energy conservation;Joint Manipulations;Spinal Manipulations;Vasopneumatic Device;Taping    PT Next Visit Plan assess tolerance to HEP; postural correction and exercises, thoracic mobs    PT Home Exercise Plan Access Code: [email or web address] by: Oren Bracket.Standing Upper Trapezius Mobilization with Small Ball - 1 x daily - 7 x weekly - 2 sets - 10 reps    Consulted and Agree with Plan of Care Patient           Patient will benefit from skilled therapeutic intervention in order to improve the following deficits and impairments:  Decreased range of motion, Decreased endurance, Decreased activity tolerance, Pain, Improper body mechanics, Decreased mobility, Decreased strength, Postural dysfunction  Visit Diagnosis: Chronic bilateral low back pain without sciatica  Pain in thoracic spine  Cervicalgia  Abnormal posture     Problem List Patient Active Problem List   Diagnosis Date Noted  . Muscle fatigue 04/26/2020  . Right-sided thoracic back pain 04/07/2020  . Dyspnea 04/07/2020  . Snoring 08/30/2017  . Hypertriglyceridemia 08/20/2017  . Screening for breast cancer 08/20/2017  . Lumbar radiculopathy 08/17/2017  . Essential hypertension 07/27/2017  . Cervical spinal stenosis 06/26/2017  . Cervical radiculopathy 05/10/2017    Carney Living PT DPT  05/26/2020, 8:43 AM  [facility] [address] [place], [place], [address] Phone: [phone]   Fax:  [phone]  Name: Deborah [name] MRN:  [id number] Date of Birth: 1956/04/14

## 2020-06-01 ENCOUNTER — Encounter: Payer: Medicare Other | Admitting: Physical Therapy

## 2020-06-03 ENCOUNTER — Ambulatory Visit: Payer: Medicare Other | Admitting: Physical Therapy

## 2020-06-08 ENCOUNTER — Ambulatory Visit: Payer: Medicare Other | Attending: Family Medicine | Admitting: Physical Therapy

## 2020-06-08 ENCOUNTER — Encounter: Payer: Self-pay | Admitting: Physical Therapy

## 2020-06-08 ENCOUNTER — Other Ambulatory Visit: Payer: Self-pay

## 2020-06-08 DIAGNOSIS — M546 Pain in thoracic spine: Secondary | ICD-10-CM | POA: Diagnosis not present

## 2020-06-08 DIAGNOSIS — M542 Cervicalgia: Secondary | ICD-10-CM

## 2020-06-08 DIAGNOSIS — M545 Low back pain, unspecified: Secondary | ICD-10-CM

## 2020-06-08 DIAGNOSIS — R293 Abnormal posture: Secondary | ICD-10-CM

## 2020-06-08 DIAGNOSIS — G8929 Other chronic pain: Secondary | ICD-10-CM

## 2020-06-09 ENCOUNTER — Encounter: Payer: Self-pay | Admitting: Physical Therapy

## 2020-06-09 NOTE — Therapy (Signed)
[place], [place], [address] Phone: [phone]   Fax:  [phone]  Physical Therapy Treatment  Patient Details  Name: Deborah [name] MRN: [id number] Date of Birth: 09/12/1956 Referring Provider (PT): Dr Dorris Singh   Encounter Date: 06/08/2020   PT End of Session - 06/08/20 1547    Visit Number 3    Number of Visits 6    Date for PT Re-Evaluation 06/21/20    Authorization Type Medicare    PT Start Time 4034    PT Stop Time 1623    PT Time Calculation (min) 40 min    Activity Tolerance Patient tolerated treatment well    Behavior During Therapy [facility] for tasks assessed/performed           Past Medical History:  Diagnosis Date  . Arthritis   . Hypertension     Past Surgical History:  Procedure Laterality Date  . carpel tunnel Right 2005  . CESAREAN SECTION  05/14/1993  . COLONOSCOPY     ~10 yrs ago- normal per pt- in [place] per pt     There were no vitals filed for this visit.   Subjective Assessment - 06/08/20 1546    Subjective Patient reports she has felt a little down over the past week. She has not done her exercises buecause she did not feel like it would be good for her.    Limitations Lifting;Sitting;Walking;House hold activities    How long can you stand comfortably? 5 minutes    How long can you walk comfortably? 5 minutes    Diagnostic tests nothing recently    Patient Stated Goals decrease pain; be able to walk    Currently in Pain? No/denies                             [facility] Adult PT Treatment/Exercise - 06/09/20 0001      Neck Exercises: Machines for Strengthening   Nustep 5 min L2       Neck Exercises: Theraband   Scapula Retraction Limitations 2x10 red     Shoulder Extension Limitations 2x10 red       Neck Exercises: Seated   Other Seated Exercise seated bilateral ER 2x10 red; horizontal abdcution red 2x10; bilateral shoulder flexion  2x10 with stick; scap retraction yellow 2x10;       Lumbar Exercises: Seated   LAQ on Chair Weights (lbs) LAQ 3x10 yellow each leg     Other Seated Lumbar Exercises seated clamshell 3x10     Other Seated Lumbar Exercises seated hamstring curl                   PT Education - 06/08/20 1547    Education Details reviewed updated HEP    Person(s) Educated Patient    Methods Explanation;Demonstration;Verbal cues;Tactile cues    Comprehension Verbalized understanding;Returned demonstration;Verbal cues required;Tactile cues required            PT Short Term Goals - 05/26/20 0842      PT SHORT TERM GOAL #1   Title Therapy will review FOTO with patient next visit    Time 2    Period Weeks    Status On-going    Target Date 05/24/20      PT SHORT TERM GOAL #2   Title Patient will be indepdnent with basic exercise program for postural correction    Time 3    Period Weeks  Status On-going    Target Date 06/01/20      PT SHORT TERM GOAL #3   Title Patient will report a 50% decrease in frequency and intesnity of pain in back and neck    Time 3    Period Weeks    Status On-going    Target Date 06/01/20             PT Long Term Goals - 05/11/20 0710      PT LONG TERM GOAL #1   Title Patient will perfrom 1 hour of housework without significant increase in pain    Time 6    Period Weeks    Status New    Target Date 06/22/20      PT LONG TERM GOAL #2   Title Patient will walk 1 mile without significant pain as it is her preffered method of exercising    Time 6    Period Weeks    Status New    Target Date 06/22/20      PT LONG TERM GOAL #3   Title Patient will increase strength in bilateral UE and LE to >4+/5    Time 6    Period Weeks    Status New    Target Date 06/22/20                 Plan - 06/08/20 1547    Clinical Impression Statement Therpay was bale to add nu-step to improve cardiovasuclar endurance. She was able to do 5 min at L1 which  will be a starting point for her. We will try to progress her as tolerated. She was encouraged to work on her exercises at home even if she is feeling a little out of it. Shre had no increase in pain with exercises. therapy will assess her plan nextvisit. she may take her exercises and continue at home. She feels like she is moving better and able to do more.    Personal Factors and Comorbidities Time since onset of injury/illness/exacerbation    Examination-Activity Limitations Bend;Carry;Lift;Stand;Stairs;Squat;Sit;Reach Overhead    Examination-Participation Restrictions Community Activity;Laundry;Occupation    Stability/Clinical Decision Making Evolving/Moderate complexity    Clinical Decision Making Moderate    Rehab Potential Fair    PT Frequency 1x / week    PT Duration 4 weeks    PT Treatment/Interventions ADLs/Self Care Home Management;Ultrasound;Traction;Moist Heat;Iontophoresis 4mg /ml Dexamethasone;Functional mobility training;Therapeutic activities;Therapeutic exercise;Manual techniques;Patient/family education;Passive range of motion;Dry needling;Energy conservation;Joint Manipulations;Spinal Manipulations;Vasopneumatic Device;Taping    PT Next Visit Plan assess tolerance to HEP; postural correction and exercises, thoracic mobs    PT Home Exercise Plan Access Code: [email or web address] by: Oren Bracket.Standing Upper Trapezius Mobilization with Small Ball - 1 x daily - 7 x weekly - 2 sets - 10 reps    Consulted and Agree with Plan of Care Patient           Patient will benefit from skilled therapeutic intervention in order to improve the following deficits and impairments:  Decreased range of motion, Decreased endurance, Decreased activity tolerance, Pain, Improper body mechanics, Decreased mobility, Decreased strength, Postural dysfunction  Visit Diagnosis: Chronic bilateral low back pain without sciatica  Pain in thoracic spine  Cervicalgia  Abnormal  posture     Problem List Patient Active Problem List   Diagnosis Date Noted  . Muscle fatigue 04/26/2020  . Right-sided thoracic back pain 04/07/2020  . Dyspnea 04/07/2020  . Snoring 08/30/2017  . Hypertriglyceridemia 08/20/2017  . Screening for breast cancer 08/20/2017  . Lumbar  radiculopathy 08/17/2017  . Essential hypertension 07/27/2017  . Cervical spinal stenosis 06/26/2017  . Cervical radiculopathy 05/10/2017    Carney Living PT DPT  06/09/2020, 1:24 PM  [facility] [address] [place], [place], [address] Phone: [phone]   Fax:  [phone]  Name: Deborah [name] MRN: [id number] Date of Birth: 02-Nov-1955

## 2020-06-23 ENCOUNTER — Ambulatory Visit: Payer: Medicare Other | Admitting: Physical Therapy

## 2020-07-27 ENCOUNTER — Ambulatory Visit: Payer: Medicare Other | Attending: Family Medicine | Admitting: Physical Therapy

## 2020-07-27 ENCOUNTER — Other Ambulatory Visit: Payer: Self-pay

## 2020-07-27 DIAGNOSIS — M546 Pain in thoracic spine: Secondary | ICD-10-CM

## 2020-07-27 DIAGNOSIS — M545 Low back pain, unspecified: Secondary | ICD-10-CM | POA: Insufficient documentation

## 2020-07-27 DIAGNOSIS — G8929 Other chronic pain: Secondary | ICD-10-CM

## 2020-07-27 DIAGNOSIS — R293 Abnormal posture: Secondary | ICD-10-CM

## 2020-07-27 DIAGNOSIS — M542 Cervicalgia: Secondary | ICD-10-CM | POA: Diagnosis not present

## 2020-07-28 ENCOUNTER — Encounter: Payer: Self-pay | Admitting: Physical Therapy

## 2020-07-28 NOTE — Therapy (Signed)
[place], [place], [address] Phone: [phone]   Fax:  [phone]  Physical Therapy Treatment  Patient Details  Name: Deborah [name] MRN: [id number] Date of Birth: 1956/09/16 Referring Provider (PT): Dr Dorris Singh   Encounter Date: 07/27/2020   PT End of Session - 07/28/20 1310    Visit Number 4    Number of Visits 6    Date for PT Re-Evaluation 07/29/20    Authorization Type Medicare    PT Start Time 1630    PT Stop Time 1708    PT Time Calculation (min) 38 min    Activity Tolerance Patient tolerated treatment well    Behavior During Therapy [facility] for tasks assessed/performed           Past Medical History:  Diagnosis Date  . Arthritis   . Hypertension     Past Surgical History:  Procedure Laterality Date  . carpel tunnel Right 2005  . CESAREAN SECTION  05/14/1993  . COLONOSCOPY     ~10 yrs ago- normal per pt- in [place] per pt     There were no vitals filed for this visit.   Subjective Assessment - 07/28/20 1308    Subjective Patient has not been back to therapy for several weeks now. She had some things come up then could not get scheduled. She has been working on her exercises at home but some days she gets tired.    Limitations Lifting;Sitting;Walking;House hold activities    How long can you stand comfortably? 5 minutes    How long can you walk comfortably? 5 minutes    Diagnostic tests nothing recently    Patient Stated Goals decrease pain; be able to walk    Currently in Pain? No/denies                             [facility] Adult PT Treatment/Exercise - 07/28/20 0001      Self-Care   Other Self-Care Comments  therapy reviewed progression of exercises going forward. Therapy also spent time educating patient on the benfits of walking. She is afraid she willl be short of breath. She was advised if she has increased shortness of breath sheshould make sure  she talks to her DR. Therapy also reviewed progression of exercises and activity.       Neck Exercises: Machines for Strengthening   Nustep 5 min L2       Neck Exercises: Theraband   Scapula Retraction Limitations 2x10 red     Shoulder Extension Limitations 2x10 red       Neck Exercises: Standing   Other Standing Exercises biceps flexion with posture 2x15 bilateral     Other Standing Exercises bilatewral flexion 2x10 2lb scaption 2x10 2lb with cuing to keep good posture.       Lumbar Exercises: Seated   LAQ on Chair Weights (lbs) LAQ 3x10 red each leg     Other Seated Lumbar Exercises seated clamshell 3x10 red     Other Seated Lumbar Exercises seated hamstring curl  3x10 red reviewed technoqie and progression for all seated exercises.       Manual Therapy   Manual therapy comments reviewed self soft tissue mobilization for home.                   PT Education - 07/28/20 1309    Education Details reviewed final HEP    Person(s) Educated Patient  Methods Explanation;Demonstration;Tactile cues;Verbal cues    Comprehension Verbalized understanding;Returned demonstration;Verbal cues required;Tactile cues required            PT Short Term Goals - 05/26/20 0842      PT SHORT TERM GOAL #1   Title Therapy will review FOTO with patient next visit    Time 2    Period Weeks    Status On-going    Target Date 05/24/20      PT SHORT TERM GOAL #2   Title Patient will be indepdnent with basic exercise program for postural correction    Time 3    Period Weeks    Status On-going    Target Date 06/01/20      PT SHORT TERM GOAL #3   Title Patient will report a 50% decrease in frequency and intesnity of pain in back and neck    Time 3    Period Weeks    Status On-going    Target Date 06/01/20             PT Long Term Goals - 05/11/20 0710      PT LONG TERM GOAL #1   Title Patient will perfrom 1 hour of housework without significant increase in pain    Time 6     Period Weeks    Status New    Target Date 06/22/20      PT LONG TERM GOAL #2   Title Patient will walk 1 mile without significant pain as it is her preffered method of exercising    Time 6    Period Weeks    Status New    Target Date 06/22/20      PT LONG TERM GOAL #3   Title Patient will increase strength in bilateral UE and LE to >4+/5    Time 6    Period Weeks    Status New    Target Date 06/22/20                 Plan - 07/28/20 1310    Clinical Impression Statement Patient has reached goals for therapy. she is on an exercise program. She is doing her exercises at home. She has not had much pain. She still feels deconditioned but it is better. She feels comfortable with her ther-ex. She has bought some hand weights. She was advised to use her handweights at home and was given an upated HEP with hand weights. She was encouraged to keep walking and to keep moving as mcuh as able. She continues to be limited by dyspnea with gait or fear of dyspnea.    Personal Factors and Comorbidities Time since onset of injury/illness/exacerbation    Examination-Activity Limitations Bend;Carry;Lift;Stand;Stairs;Squat;Sit;Reach Overhead    Examination-Participation Restrictions Community Activity;Laundry;Occupation    Stability/Clinical Decision Making Evolving/Moderate complexity    Clinical Decision Making Moderate    Rehab Potential Fair    PT Frequency 1x / week    PT Duration 4 weeks    PT Treatment/Interventions ADLs/Self Care Home Management;Ultrasound;Traction;Moist Heat;Iontophoresis 4mg/ml Dexamethasone;Functional mobility training;Therapeutic activities;Therapeutic exercise;Manual techniques;Patient/family education;Passive range of motion;Dry needling;Energy conservation;Joint Manipulations;Spinal Manipulations;Vasopneumatic Device;Taping    PT Next Visit Plan assess tolerance to HEP; postural correction and exercises, thoracic mobs    PT Home Exercise Plan Access Code:  [email or web address] by: David CarrollExercises.Standing Upper Trapezius Mobilization with Small Ball - 1 x daily - 7 x weekly - 2 sets - 10 reps           Patient will benefit from   skilled therapeutic intervention in order to improve the following deficits and impairments:  Decreased range of motion, Decreased endurance, Decreased activity tolerance, Pain, Improper body mechanics, Decreased mobility, Decreased strength, Postural dysfunction  Visit Diagnosis: Chronic bilateral low back pain without sciatica  Pain in thoracic spine  Cervicalgia  Abnormal posture    PHYSICAL THERAPY DISCHARGE SUMMARY  Visits from Start of Care: 4  Current functional level related to goals / functional outcomes: Significant improvement in pain and fucntion   Remaining deficits: Still not walking long distances   Education / Equipment: HEP   Plan: Patient agrees to discharge.  Patient goals were met. Patient is being discharged due to meeting the stated rehab goals.  ?????       Problem List Patient Active Problem List   Diagnosis Date Noted  . Muscle fatigue 04/26/2020  . Right-sided thoracic back pain 04/07/2020  . Dyspnea 04/07/2020  . Snoring 08/30/2017  . Hypertriglyceridemia 08/20/2017  . Screening for breast cancer 08/20/2017  . Lumbar radiculopathy 08/17/2017  . Essential hypertension 07/27/2017  . Cervical spinal stenosis 06/26/2017  . Cervical radiculopathy 05/10/2017    Carney Living PT DPT  07/28/2020, 1:13 PM  [facility] [address] [place], [place], [address] Phone: [phone]   Fax:  [phone]  Name: Deborah [name] MRN: [id number] Date of Birth: 09-Sep-1956

## 2020-08-01 NOTE — Progress Notes (Signed)
Subjective:   Patient ID: Deborah [name]    DOB: 12-17-1955, 64 y.o. female   MRN: [id number]  Gaye Scorza is a 64 y.o. female with a history of HTN, cervical and lumbar radiculopathy, cervical spine stenosis, dyspnea, hypertriglyceridemia, muscle fatigue, and history of snoring here for DEXA scan.  Acute Concerns: none   HTN:  BP: 115/80 today. Currently on HCTZ 25mg  QD. Endorses compliance. Non-smoker. Denies any chest pain, SOB, vision changes, or headaches.   HLD: Last lipid panel below. Currently on Lipitor 40mg  QD. Endorses compliance. Denies any muscles aches or weakness. The 10-year ASCVD risk score Mikey Bussing DC Brooke Bonito., et al., 2013) is: 5.7% She notes that she has been trying to eat better and would like to discuss coming off of the cholesterol medicine.  Lab Results  Component Value Date   CHOL 152 01/05/2020   HDL 50 01/05/2020   LDLCALC 81 01/05/2020   TRIG 114 01/05/2020   CHOLHDL 3.0 01/05/2020    Health Maintenance: Health Maintenance Due  Topic  . TETANUS/TDAP   . DEXA SCAN   . PNA vac Low Risk Adult (1 of 2 - PCV13)   Review of Systems:  Per HPI.   Objective:   BP 115/80   Pulse 79   Ht 5\' 2"  (1.575 m)   Wt 139 lb 12.8 oz (63.4 kg)   SpO2 100%   BMI 25.57 kg/m  Vitals and nursing note reviewed.  General: pleasant older female, sitting comfortably in exam chair, well nourished, well developed, in no acute distress with non-toxic appearance Resp: breathing comfortably on room air, speaking in full sentences MSK:  gait normal Neuro: Alert and oriented, speech normal  Assessment & Plan:   Essential hypertension Chronc, well controlled. She does desire to be weaned off antihypertensive. Discussed improtance of BP control and effect of weight loss. It is difficult for patient to do physical activity due to pain thus she only can refer to diet modifications which she is doing.  - opted to continue HCTZ 25mg  QD - Discussed healthy eating, portion  control, and limiting carbohydrates.  - can consider weaning if BP remains controlled  Hyperlipidemia She has had improvement in cholesterol panel with statin. She has been working on diet modifications too. She would like to trial off of statin at this time. Discussed ASCVD disease risk score of 5.7% and with shared decision making we have opted to stop Statin at this time. Recommend follow up in April 2022 for repeat lipid panel. If worsening, would discuss restarting. She is amendable to this plan.   Prediabetes A1C elevated to 5.7 placing patient in prediabetes range.  Discussed healthy eating, portion control, and limiting carbohydrates. Recheck A1C yearly   Health Maintenance: - Due for DEXA scan - order placed. Continue Calcium and Vitamin D -  Due for pap-smear - due in April 2022. If negative likely could discontinue further screening - Discussed PNA vaccine - amendable to obtaining at follow up visit - TDAP - feels like she has had this within last 10 years in order to obtain green card. Recommended she look for records or consider obtaining booster given uncertainty   Orders Placed This Encounter  Procedures  . DG Bone Density    uhc mcr  Pf: baseline Wt139 - stop calc/multi 48 hrs prior / no needs dp w pt  Epic order    Standing Status:   Future    Standing Expiration Date:   08/01/2021    Order Specific Question:  Reason for Exam (SYMPTOM  OR DIAGNOSIS REQUIRED)    Answer:   screening for osteoporsis    Order Specific Question:   Preferred imaging location?    Answer:   [facility]  . Flu Vaccine QUAD 36+ mos IM  . POCT glycosylated hemoglobin (Hb A1C)    Mina Marble, DO PGY-3, [facility] 08/03/2020 8:53 PM

## 2020-08-02 ENCOUNTER — Encounter: Payer: Self-pay | Admitting: Family Medicine

## 2020-08-02 ENCOUNTER — Other Ambulatory Visit: Payer: Self-pay

## 2020-08-02 ENCOUNTER — Ambulatory Visit (INDEPENDENT_AMBULATORY_CARE_PROVIDER_SITE_OTHER): Payer: Medicare Other | Admitting: Family Medicine

## 2020-08-02 VITALS — BP 115/80 | HR 79 | Ht 62.0 in | Wt 139.8 lb

## 2020-08-02 DIAGNOSIS — E782 Mixed hyperlipidemia: Secondary | ICD-10-CM

## 2020-08-02 DIAGNOSIS — Z131 Encounter for screening for diabetes mellitus: Secondary | ICD-10-CM

## 2020-08-02 DIAGNOSIS — E2839 Other primary ovarian failure: Secondary | ICD-10-CM

## 2020-08-02 DIAGNOSIS — R7303 Prediabetes: Secondary | ICD-10-CM

## 2020-08-02 DIAGNOSIS — I10 Essential (primary) hypertension: Secondary | ICD-10-CM

## 2020-08-02 DIAGNOSIS — Z23 Encounter for immunization: Secondary | ICD-10-CM

## 2020-08-02 LAB — POCT GLYCOSYLATED HEMOGLOBIN (HGB A1C): Hemoglobin A1C: 5.7 % — AB (ref 4.0–5.6)

## 2020-08-02 NOTE — Patient Instructions (Addendum)
It was a pleasure to see you today!  Thank you for choosing [facility] for your primary care.  Deborah [name] was seen for follow up.   Our plans for today were:  We have discussed together and have decided to stop your cholesterol medicine. We will get repeat cholesterol panel in April  Continue your blood pressure medicine  Call to schedule your DEXA scan at earliest convenience  Please try to obtain your Tetanus vaccine results or if you cant find them, I would recommend considering getting a booster at some point.   To keep you healthy, please keep in mind the following health maintenance items that you are due for:   1. Pneumonia vaccine - plan to get in April  2. Pap-smear - plan to obtain in April   You should return to our clinic in April 2022 for follow up.   Best Wishes,   Mina Marble, DO

## 2020-08-03 ENCOUNTER — Encounter: Payer: Self-pay | Admitting: Family Medicine

## 2020-08-03 DIAGNOSIS — R7303 Prediabetes: Secondary | ICD-10-CM | POA: Insufficient documentation

## 2020-08-03 NOTE — Assessment & Plan Note (Signed)
A1C elevated to 5.7 placing patient in prediabetes range.  Discussed healthy eating, portion control, and limiting carbohydrates. Recheck A1C yearly

## 2020-08-03 NOTE — Assessment & Plan Note (Signed)
She has had improvement in cholesterol panel with statin. She has been working on diet modifications too. She would like to trial off of statin at this time. Discussed ASCVD disease risk score of 5.7% and with shared decision making we have opted to stop Statin at this time. Recommend follow up in April 2022 for repeat lipid panel. If worsening, would discuss restarting. She is amendable to this plan.

## 2020-08-03 NOTE — Assessment & Plan Note (Addendum)
Chronc, well controlled. She does desire to be weaned off antihypertensive. Discussed improtance of BP control and effect of weight loss. It is difficult for patient to do physical activity due to pain thus she only can refer to diet modifications which she is doing.  - opted to continue HCTZ 25mg  QD - Discussed healthy eating, portion control, and limiting carbohydrates.  - can consider weaning if BP remains controlled

## 2020-09-01 DIAGNOSIS — Z20822 Contact with and (suspected) exposure to covid-19: Secondary | ICD-10-CM | POA: Diagnosis not present

## 2020-11-11 ENCOUNTER — Other Ambulatory Visit: Payer: Medicare Other

## 2020-11-25 ENCOUNTER — Other Ambulatory Visit: Payer: Medicare Other

## 2020-12-14 DIAGNOSIS — Z20822 Contact with and (suspected) exposure to covid-19: Secondary | ICD-10-CM | POA: Diagnosis not present

## 2020-12-28 ENCOUNTER — Other Ambulatory Visit: Payer: Self-pay | Admitting: Family Medicine

## 2020-12-28 ENCOUNTER — Telehealth: Payer: Self-pay

## 2020-12-28 DIAGNOSIS — I10 Essential (primary) hypertension: Secondary | ICD-10-CM

## 2020-12-28 MED ORDER — HYDROCHLOROTHIAZIDE 25 MG PO TABS: 25.0000 mg | ORAL_TABLET | Freq: Every day | ORAL | 1 refills | Status: DC

## 2020-12-28 NOTE — Telephone Encounter (Signed)
Appears in the past she was transitioned to just HCTZ 25mg  QD with good BP control. I have continued this and send in refills for her.

## 2020-12-28 NOTE — Telephone Encounter (Signed)
Received call from pharmacy regarding rx request for valsartan-HCTZ 320-25 mg.   Per chart review, medication was discontinued. Please advise if patient should still be taking medication. If so, please send new refill to [facility] on [address].   Talbot Grumbling, RN

## 2020-12-31 ENCOUNTER — Telehealth: Payer: Self-pay

## 2020-12-31 ENCOUNTER — Other Ambulatory Visit: Payer: Self-pay | Admitting: Family Medicine

## 2020-12-31 DIAGNOSIS — Z1231 Encounter for screening mammogram for malignant neoplasm of breast: Secondary | ICD-10-CM

## 2020-12-31 NOTE — Telephone Encounter (Signed)
Patient calls nurse line reporting she has finished her bottle of HCTZ and would like to see how she does without. I advised patient to keep a journal of her BPs without taking the medication. If patient has high readings to call back for HCTZ refill. Symptoms of high blood pressure and ED precautions given to patient. Apt scheduled for 4/11 with PCP to discuss.

## 2021-01-09 NOTE — Progress Notes (Signed)
   Subjective:   Patient ID: Deborah [name]    DOB: 12-01-1955, 65 y.o. female   MRN: [id number]  Deborah [name] is a 65 y.o. female with a history of HTN, cervical and lumbar radiculopathy, cervical spinal stenosis, dyspnea, HLD, prediabetes, snoring here for blood pressure follow up  HTN:  BP: 140/82 today. Was seen in UC due to lightheadedness and found to have significantly elevated BP to 188/97. She self-discontinued her Valsartan-HCTZ recently and noted to have elevated home BP readings. She restarted her medicine this morning. She denies any gait problems, weakness, speech changes, tinnitus, otalgia, CP, SOB, palpitation or recent infection. EKG, CBC, BMP, and UA normal at urgent care. She had a normal neurologic exam. Non-smoker.   Lab Results  Component Value Date   CREATININE 0.65 01/10/2021   CREATININE 0.70 01/05/2020   CREATININE 0.68 08/27/2019    Health Maintenance: Health Maintenance Due  Topic  . TETANUS/TDAP   . DEXA SCAN   . PNA vac Low Risk Adult (1 of 2 - PCV13)  . COVID-19 Vaccine (3 - Booster for Moderna series)  . PAP SMEAR-Modifier    Review of Systems:  Per HPI.   Objective:   BP 140/82   Pulse 65   Wt 138 lb (62.6 kg)   SpO2 99%   BMI 25.24 kg/m  Vitals and nursing note reviewed.  General: pleasant older woman, sitting comfortably in exam chair, well nourished, well developed, in no acute distress with non-toxic appearance HEENT: normocephalic, atraumatic, moist mucous membranes, oropharynx clear without erythema or exudate Neck: supple, non-tender without lymphadenopathy, no thyromegaly CV: regular rate and rhythm without murmurs, rubs, or gallops, no lower extremity edema, 2+ radial and pedal pulses bilaterally Lungs: clear to auscultation bilaterally with normal work of breathing on room air, speaking in full sentences Abdomen: soft, non-tender, non-distended, normoactive bowel sounds Skin: warm, dry Extremities: warm and well perfused,  normal tone MSK: gait normal Neuro: Alert and oriented, speech normal  Assessment & Plan:   Essential hypertension Chronic, uncontrolled due to medication adherence. Restarted Valsartan-HCTZ today with improvement in blood pressures. - continue Valsartan-HCTZ 320-25mg  daily - follow up in 2 weeks for BP check - repeat BMP at that time  Health Maintenance: - DEXA scan scheduled for 04/25/21 - COVID booster in December 2021. She will bring records with her at next visit - Papsmear at follow up visit 2 weeks  No orders of the defined types were placed in this encounter.  Meds ordered this encounter  Medications  . valsartan-hydrochlorothiazide (DIOVAN-HCT) 320-25 MG tablet    Sig: Take 1 tablet by mouth daily.    Dispense:  90 tablet    Refill:  [place], DO PGY-3, [facility] 01/11/2021 2:00 PM

## 2021-01-10 ENCOUNTER — Ambulatory Visit (INDEPENDENT_AMBULATORY_CARE_PROVIDER_SITE_OTHER): Payer: Medicare Other | Admitting: Family Medicine

## 2021-01-10 ENCOUNTER — Other Ambulatory Visit: Payer: Self-pay

## 2021-01-10 ENCOUNTER — Encounter: Payer: Self-pay | Admitting: Family Medicine

## 2021-01-10 ENCOUNTER — Encounter (HOSPITAL_COMMUNITY): Payer: Self-pay

## 2021-01-10 ENCOUNTER — Ambulatory Visit (HOSPITAL_COMMUNITY)
Admission: EM | Admit: 2021-01-10 | Discharge: 2021-01-10 | Disposition: A | Discharge status: Home / Self Care | Payer: Medicare Other | Attending: Family Medicine | Admitting: Family Medicine

## 2021-01-10 VITALS — BP 140/82 | HR 65 | Wt 138.0 lb

## 2021-01-10 DIAGNOSIS — R42 Dizziness and giddiness: Secondary | ICD-10-CM | POA: Diagnosis not present

## 2021-01-10 DIAGNOSIS — I10 Essential (primary) hypertension: Secondary | ICD-10-CM

## 2021-01-10 LAB — POCT URINALYSIS DIPSTICK, ED / UC
Bilirubin Urine: NEGATIVE
Glucose, UA: NEGATIVE mg/dL
Hgb urine dipstick: NEGATIVE
Ketones, ur: NEGATIVE mg/dL
Leukocytes,Ua: NEGATIVE
Nitrite: NEGATIVE
Protein, ur: NEGATIVE mg/dL
Specific Gravity, Urine: 1.02 (ref 1.005–1.030)
Urobilinogen, UA: 0.2 mg/dL (ref 0.0–1.0)
pH: 6 (ref 5.0–8.0)

## 2021-01-10 LAB — BASIC METABOLIC PANEL
Anion gap: 7 (ref 5–15)
BUN: 10 mg/dL (ref 8–23)
CO2: 28 mmol/L (ref 22–32)
Calcium: 9.5 mg/dL (ref 8.9–10.3)
Chloride: 101 mmol/L (ref 98–111)
Creatinine, Ser: 0.65 mg/dL (ref 0.44–1.00)
GFR, Estimated: >60 mL/min (ref >60)
Glucose, Bld: 130 mg/dL — ABNORMAL HIGH (ref 70–99)
Potassium: 4.2 mmol/L (ref 3.5–5.1)
Sodium: 136 mmol/L (ref 135–145)

## 2021-01-10 LAB — CBC
HCT: 45.4 % (ref 36.0–46.0)
Hemoglobin: 15.4 g/dL — ABNORMAL HIGH (ref 12.0–15.0)
MCH: 29.7 pg (ref 26.0–34.0)
MCHC: 33.9 g/dL (ref 30.0–36.0)
MCV: 87.6 fL (ref 80.0–100.0)
Platelets: 250 10*3/uL (ref 150–400)
RBC: 5.18 MIL/uL — ABNORMAL HIGH (ref 3.87–5.11)
RDW: 12.3 % (ref 11.5–15.5)
WBC: 6.8 10*3/uL (ref 4.0–10.5)
nRBC: 0 % (ref 0.0–0.2)

## 2021-01-10 MED ORDER — VALSARTAN-HYDROCHLOROTHIAZIDE 320-25 MG PO TABS: 1.0000 | ORAL_TABLET | Freq: Every day | ORAL | 1 refills | Status: DC

## 2021-01-10 NOTE — Discharge Instructions (Addendum)
Keep your primary care appointment today.  Your blood pressure was noted to be elevated during your visit today. You ECG (heart tracing) did not show any signs of a heart attack  BP (!) 188/97   Pulse 62   Temp 97.9 F (36.6 C)   Resp 17   SpO2 100%   BP Readings from Last 3 Encounters:  01/10/21 (!) 188/97  08/02/20 115/80  04/26/20 (!) 114/62   You have had labs (blood work) drawn today. We will call you with any significant abnormalities or if there is need to begin or change treatment or pursue further follow up.  You may also review your test results online through [facility]. If you do not have a MyChart account, instructions to sign up should be on your discharge paperwork.

## 2021-01-10 NOTE — Assessment & Plan Note (Addendum)
Chronic, uncontrolled due to medication adherence. Restarted Valsartan-HCTZ today with improvement in blood pressures. - continue Valsartan-HCTZ 320-25mg  daily - follow up in 2 weeks for BP check - repeat BMP at that time

## 2021-01-10 NOTE — ED Triage Notes (Signed)
Pt in with c/o feeling nauseous and dizzy this morning when she got up  States she took her bp this morning and it was in the 160's over 140's

## 2021-01-10 NOTE — ED Provider Notes (Signed)
[place]   [id number] 01/10/21 Arrival Time: 6546  ASSESSMENT & PLAN:  1. Dizziness   2. Elevated blood pressure reading in office with diagnosis of hypertension      Discharge Instructions      Keep your primary care appointment today.  Your blood pressure was noted to be elevated during your visit today. You ECG (heart tracing) did not show any signs of a heart attack  BP (!) 188/97   Pulse 62   Temp 97.9 F (36.6 C)   Resp 17   SpO2 100%   BP Readings from Last 3 Encounters:  01/10/21 (!) 188/97  08/02/20 115/80  04/26/20 (!) 114/62        Pending: Labs Reviewed  CBC  BASIC METABOLIC PANEL   Normal neurologic exam. No indication for urgent neurodiagnostic imaging at this time. Discussed.  Has PCP appt this afternoon; will keep.  Reviewed expectations re: course of current medical issues. Questions answered. Outlined signs and symptoms indicating need for more acute intervention. Patient verbalized understanding. After Visit Summary given.   SUBJECTIVE:  Deborah [name] is a 65 y.o. female who reports abrupt onset of dizziness described as lightheadedness with very mild vertigo; h/o vertigo "and this is not as bad as that". Current symptoms first noted this morning; feel well upon waking; ate breakfast. Reports self-discontinuing her BP medications recently; reports pressures have been elevated per home BP cuff. Denies gait problems, headaches, paresthesia, speech problems, tremors and weakness as well as aural pressure, otalgia and tinnitus. No CP/SOB/palpitations. Recent infections: none. Head trauma: denied. Noise exposure: none. No LE edema or LE pain. Reports normal bowel/bladder habits. Therapies tried thus far: took BP medication PTA.  Social History   Substance and Sexual Activity  Alcohol Use Never   Social History   Tobacco Use  Smoking Status Never Smoker  Smokeless Tobacco Never Used    OBJECTIVE:  Vitals:    01/10/21 1232  BP: (!) 188/97  Pulse: 62  Resp: 17  Temp: 97.9 F (36.6 C)  SpO2: 100%    General appearance: alert; no distress Eyes: PERRLA; EOMI; conjunctiva normal HENT: normocephalic; atraumatic; TMs normal; nasal mucosa normal; oral mucosa normal  Neck: supple with FROM Lungs: clear to auscultation bilaterally Heart: regular rate and rhythm Extremities: no cyanosis or edema; symmetrical with no gross deformities Skin: warm and dry Neurologic: normal gait; CN 2-12 grossly intact Psychological: alert and cooperative; normal mood and affect  Investigations: Results for orders placed or performed during the hospital encounter of 01/10/21  POC Urinalysis dipstick  Result Value Ref Range   Glucose, UA NEGATIVE NEGATIVE mg/dL   Bilirubin Urine NEGATIVE NEGATIVE   Ketones, ur NEGATIVE NEGATIVE mg/dL   Specific Gravity, Urine 1.020 1.005 - 1.030   Hgb urine dipstick NEGATIVE NEGATIVE   pH 6.0 5.0 - 8.0   Protein, ur NEGATIVE NEGATIVE mg/dL   Urobilinogen, UA 0.2 0.0 - 1.0 mg/dL   Nitrite NEGATIVE NEGATIVE   Leukocytes,Ua NEGATIVE NEGATIVE   Labs Reviewed  CBC  BASIC METABOLIC PANEL  POCT URINALYSIS DIPSTICK, ED / UC   Allergies  Allergen Reactions  . Pork-Derived Products     Religious reasons    Past Medical History:  Diagnosis Date  . Arthritis   . Hypertension    Social History   Socioeconomic History  . Marital status: Married    Spouse name: Not on file  . Number of children: Not on file  . Years of education: Not on file  .  Highest education level: Not on file  Occupational History  . Occupation: [employer]   Tobacco Use  . Smoking status: Never Smoker  . Smokeless tobacco: Never Used  Vaping Use  . Vaping Use: Never used  Substance and Sexual Activity  . Alcohol use: Never  . Drug use: Never  . Sexual activity: Yes    Partners: Male    Birth control/protection: Post-menopausal    Comment: Husband   Other Topics Concern  . Not on file  Social  History Narrative   Married   Patient  Use to work in [place]    2 Children - 24 (daughter)and 30 years (son)   Patient helps with granddaughter    Social Determinants of Health   Financial Resource Strain: Not on file  Food Insecurity: Not on file  Transportation Needs: Not on file  Physical Activity: Not on file  Stress: Not on file  Social Connections: Not on file  Intimate Partner Violence: Not on file   Family History  Problem Relation Age of Onset  . Ovarian cancer Mother   . Stroke Father   . Breast cancer Sister   . Colon polyps Sister   . Stroke Brother        in his early thirties  . Colon cancer Neg Hx   . Esophageal cancer Neg Hx   . Rectal cancer Neg Hx   . Stomach cancer Neg Hx    Past Surgical History:  Procedure Laterality Date  . carpel tunnel Right 2005  . CESAREAN SECTION  05/14/1993  . COLONOSCOPY     ~10 yrs ago- normal per pt- in [place] per pt       Vanessa Kick, MD 01/10/21 1317

## 2021-01-10 NOTE — Patient Instructions (Signed)
It was a pleasure to see you today!  Thank you for choosing [facility] for your primary care.   Our plans for today were:  Continue the Valsartan-HCTZ 320-25mg  daily  Follow up in 2 weeks for papsmear and blood pressure check  To keep you healthy, please keep in mind the following health maintenance items that you are due for:  1. Bring COVID vacciantion   Best Wishes,   Mina Marble, DO

## 2021-01-10 NOTE — Telephone Encounter (Signed)
Patient calls nurse line reporting high blood pressure. Patient reports she took a blood pressure pill this morning. Patient has an apt with PCP this afternoon. Patient reports she has a headache, dizzy, and some nausea. Patient denies chest pain or SOB. Patient advised to go to UC for high BP with symptoms. Patient verbalized understanding.

## 2021-01-23 NOTE — Progress Notes (Signed)
   Subjective:   Patient ID: Deborah [name]    DOB: [birth year], 65 y.o. female   MRN: [id number]  Deborah [name] is a 65 y.o. female with a history of HTN, cervical and lumbar radiculopathy, cervical spinal stenosis, dyspnea, HLD, prediaebtes, right-sided thoracic back pain, snoring here for BP follow up and pap-smear  HTN:  BP: 130/76 today. Currently on Valsartan-HCTZ 320-35mg  QD. Restarted about 2 weeks ago after self-discontinuation. Endorses compliance. Non-smoker. Home BP's have been 97-130/76-96. Had one episode of "wooziness" overnight last night. Checked BP and it was 116/86. Ate something and symptoms improved. She is currently practicing Ramadan. Denies any chest pain, SOB, vision changes, or headaches.   Lab Results  Component Value Date   CREATININE 0.65 01/10/2021   CREATININE 0.70 01/05/2020   CREATININE 0.68 08/27/2019   Health Maintenance: Health Maintenance Due  Topic  . TETANUS/TDAP   . DEXA SCAN   . PNA vac Low Risk Adult (1 of 2 - PCV13)   Review of Systems:  Per HPI.   Objective:   BP 130/76   Pulse 67   Ht 5' 1.5" (1.562 m)   Wt 135 lb (61.2 kg)   SpO2 98%   BMI 25.09 kg/m  Vitals and nursing note reviewed.  General: pleasant older woman, sitting comfortably in exam chair, well nourished, well developed, in no acute distress with non-toxic appearance Resp: breathing comfortably on room air, speaking in full sentences Skin: warm, dry Extremities:  normal tone YKD:XIPJ normal Neuro: Alert and oriented, speech normal  Assessment & Plan:   Essential hypertension Chronic, improved. Normotensive today. Home log reviewed and normotensive. Recommended continued medication. If develops persistent BP <90/<70 or lightheadedness/dizziness then she should contact me and we will decrease the medicine to Valsartan 160mg / HCTZ 25mg   Hyperlipidemia Lipid panel not obtained today. Currently not on statin due to personal choice to focus on lifestyle  modifications and healthier lifestyle.  Recommend lipid panel at follow up visit in 3 months and discuss initiation of statin if worsening  Health Maintenance: Pap-smear UPT, due in 2024 Mammogram scheduled for 02/21/21 Dexa scan scheduled for 04/25/21  Orders Placed This Encounter  Procedures  . Basic metabolic panel   No orders of the defined types were placed in this encounter.    Mina Marble, DO PGY-3, [facility] 01/26/2021 4:49 PM

## 2021-01-26 ENCOUNTER — Other Ambulatory Visit: Payer: Self-pay

## 2021-01-26 ENCOUNTER — Ambulatory Visit (INDEPENDENT_AMBULATORY_CARE_PROVIDER_SITE_OTHER): Payer: Medicare Other | Admitting: Family Medicine

## 2021-01-26 ENCOUNTER — Encounter: Payer: Self-pay | Admitting: Family Medicine

## 2021-01-26 VITALS — BP 130/76 | HR 67 | Ht 61.5 in | Wt 135.0 lb

## 2021-01-26 DIAGNOSIS — I10 Essential (primary) hypertension: Secondary | ICD-10-CM

## 2021-01-26 DIAGNOSIS — E782 Mixed hyperlipidemia: Secondary | ICD-10-CM | POA: Diagnosis not present

## 2021-01-26 NOTE — Assessment & Plan Note (Signed)
Lipid panel not obtained today. Currently not on statin due to personal choice to focus on lifestyle modifications and healthier lifestyle.  Recommend lipid panel at follow up visit in 3 months and discuss initiation of statin if worsening

## 2021-01-26 NOTE — Patient Instructions (Signed)
Please have safe travels to [place]. I am so sorry your daughter is going through so much right now. Please let me know if there is anything I can do to help you in this difficult time.  Your blood pressure is much better. Lets continue the current dose for now.  Continue to monitor. If you get persistent low blood pressures (top number <90 or bottom number <70) or develop lightheadedness/dizziness please call and I will call in a lesser dose of the prescription.   Plan to follow up with me in 3 months for blood pressure monitoring.  I am getting some labs today to monitor your kidneys and electrolytes after restarting the medicine. I will call you if there are any abnormalities.

## 2021-01-26 NOTE — Assessment & Plan Note (Signed)
Chronic, improved. Normotensive today. Home log reviewed and normotensive. Recommended continued medication. If develops persistent BP <90/<70 or lightheadedness/dizziness then she should contact me and we will decrease the medicine to Valsartan 160mg / HCTZ 25mg 

## 2021-01-27 ENCOUNTER — Encounter: Payer: Self-pay | Admitting: Family Medicine

## 2021-01-27 LAB — BASIC METABOLIC PANEL
BUN/Creatinine Ratio: 19 (ref 12–28)
BUN: 15 mg/dL (ref 8–27)
CO2: 24 mmol/L (ref 20–29)
Calcium: 9.5 mg/dL (ref 8.7–10.3)
Chloride: 101 mmol/L (ref 96–106)
Creatinine, Ser: 0.77 mg/dL (ref 0.57–1.00)
Glucose: 97 mg/dL (ref 65–99)
Potassium: 3.5 mmol/L (ref 3.5–5.2)
Sodium: 142 mmol/L (ref 134–144)
eGFR: 86 mL/min/{1.73_m2} (ref >59)

## 2021-02-02 ENCOUNTER — Ambulatory Visit: Payer: Medicare Other | Admitting: Family Medicine

## 2021-02-21 ENCOUNTER — Other Ambulatory Visit: Payer: Self-pay

## 2021-02-21 ENCOUNTER — Ambulatory Visit
Admission: RE | Admit: 2021-02-21 | Discharge: 2021-02-21 | Disposition: A | Discharge status: Home / Self Care | Payer: Medicare Other | Source: Ambulatory Visit | Attending: Family Medicine | Admitting: Family Medicine

## 2021-02-21 DIAGNOSIS — Z1231 Encounter for screening mammogram for malignant neoplasm of breast: Secondary | ICD-10-CM | POA: Diagnosis not present

## 2021-04-25 ENCOUNTER — Other Ambulatory Visit: Payer: Medicare Other

## 2021-05-05 ENCOUNTER — Other Ambulatory Visit: Payer: Self-pay | Admitting: Family Medicine

## 2021-05-05 DIAGNOSIS — I10 Essential (primary) hypertension: Secondary | ICD-10-CM

## 2021-07-07 ENCOUNTER — Other Ambulatory Visit: Payer: Self-pay

## 2021-07-07 DIAGNOSIS — I10 Essential (primary) hypertension: Secondary | ICD-10-CM

## 2021-07-07 MED ORDER — VALSARTAN-HYDROCHLOROTHIAZIDE 320-25 MG PO TABS: 1.0000 | ORAL_TABLET | Freq: Every day | ORAL | 1 refills | Status: DC

## 2021-07-07 NOTE — Telephone Encounter (Signed)
Patient calls nurse line requesting refill on valsartan-HCTZ. Patient reports that she will be in [place] until November-December.   Please advise if refill can be sent to pended pharmacy in [place].   Talbot Grumbling, RN

## 2021-11-24 ENCOUNTER — Other Ambulatory Visit: Payer: Self-pay

## 2021-11-24 ENCOUNTER — Ambulatory Visit (INDEPENDENT_AMBULATORY_CARE_PROVIDER_SITE_OTHER): Payer: Medicare Other

## 2021-11-24 VITALS — BP 112/62 | HR 78 | Ht 60.8 in | Wt 136.0 lb

## 2021-11-24 DIAGNOSIS — Z0000 Encounter for general adult medical examination without abnormal findings: Secondary | ICD-10-CM

## 2021-11-24 NOTE — Progress Notes (Addendum)
Subjective:   Deborah [name] is a 66 y.o. female who presents for Medicare Annual (Subsequent) preventive examination.  Review of Systems: Defer to PCP.  Cardiac Risk Factors include: advanced age (>69men, >22 women)  Objective:   Vitals: BP 112/62    Pulse 78    Ht 5' 0.8" (1.544 m)    Wt 136 lb (61.7 kg)    SpO2 98%    BMI 25.87 kg/m   Body mass index is 25.87 kg/m.  Advanced Directives 12/01/2021 01/10/2021 05/10/2020 04/26/2020 08/17/2017 07/27/2017 05/08/2017  Does Patient Have a Medical Advance Directive? No No No No No No No  Would patient like information on creating a medical advance directive? No - Patient declined No - Patient declined Yes (MAU/Ambulatory/Procedural Areas - Information given) No - Patient declined No - Patient declined - No - Patient declined   Tobacco Social History   Tobacco Use  Smoking Status Never   Passive exposure: Past  Smokeless Tobacco Never     Clinical intake:  Pre-visit preparation completed: Yes  Pain Score: 0-No pain  What is the last grade level you completed in school?: College  Interpreter Needed?: No  Past Medical History:  Diagnosis Date   Arthritis    Hypertension    Past Surgical History:  Procedure Laterality Date   carpel tunnel Right 2005   CESAREAN SECTION  05/14/1993   COLONOSCOPY     ~10 yrs ago- normal per pt- [place]    Family History  Problem Relation Age of Onset   Ovarian cancer Mother    Stroke Father    Breast cancer Sister    Colon polyps Sister    Stroke Brother        in his early thirties   Colon cancer Neg Hx    Esophageal cancer Neg Hx    Rectal cancer Neg Hx    Stomach cancer Neg Hx    Social History   Socioeconomic History   Marital status: Married    Spouse name: Rush Landmark   Number of children: 2   Years of education: Not on file   Highest education level: Not on file  Occupational History   Occupation: [employer]   Tobacco Use   Smoking status: Never    Passive  exposure: Past   Smokeless tobacco: Never  Vaping Use   Vaping Use: Never used  Substance and Sexual Activity   Alcohol use: Never   Drug use: Never   Sexual activity: Yes    Partners: Male    Birth control/protection: Post-menopausal    Comment: Husband   Other Topics Concern   Not on file  Social History Narrative   Patient lives with her husband [place].    Patient has two children.    Patient helps with her granddaughter.    Patient has chronic back pain which limits her exercise.       Social Determinants of Health   Financial Resource Strain: Not on file  Food Insecurity: Not on file  Transportation Needs: No Transportation Needs   Lack of Transportation (Medical): No   Lack of Transportation (Non-Medical): No  Physical Activity: Inactive   Days of Exercise per Week: 0 days   Minutes of Exercise per Session: 0 min  Stress: Not on file  Social Connections: Socially Integrated   Frequency of Communication with Friends and Family: More than three times a week   Frequency of Social Gatherings with Friends and Family: More than three times a  week   Attends Religious Services: More than 4 times per year   Active Member of Clubs or Organizations: Yes   Attends Archivist Meetings: More than 4 times per year   Marital Status: Married   Outpatient Encounter Medications as of 11/24/2021  Medication Sig   Ascorbic Acid (VITAMIN C) 1000 MG tablet Take 1,000 mg by mouth daily.   cholecalciferol (VITAMIN D3) 25 MCG (1000 UNIT) tablet Take 1,000 Units by mouth daily.   Cyanocobalamin (VITAMIN B 12 PO) Take by mouth.   Ibuprofen (ADVIL PO) Take by mouth.   Magnesium 400 MG CAPS Take by mouth.   valsartan-hydrochlorothiazide (DIOVAN-HCT) 320-25 MG tablet Take 1 tablet by mouth daily.   vitamin E 180 MG (400 UNITS) capsule Take 400 Units by mouth daily.   No facility-administered encounter medications on file as of 11/24/2021.   Activities of Daily Living In your present  state of health, do you have any difficulty performing the following activities: 11/24/2021  Hearing? N  Vision? N  Difficulty concentrating or making decisions? N  Walking or climbing stairs? N  Dressing or bathing? N  Doing errands, shopping? N  Preparing Food and eating ? N  Using the Toilet? N  In the past six months, have you accidently leaked urine? N  Do you have problems with loss of bowel control? N  Managing your Medications? N  Managing your Finances? N  Housekeeping or managing your Housekeeping? N  Some recent data might be hidden   Patient Care Team: Lurline Del, DO as PCP - General (Family Medicine)    Assessment:   This is a routine wellness examination for Deborah [name].  Exercise Activities and Dietary recommendations Current Exercise Habits: The patient does not participate in regular exercise at present, Exercise limited by: orthopedic condition(s)   Goals      Patient Stated     Control chronic back pain.        Fall Risk Fall Risk  11/24/2021 01/10/2021 04/26/2020 04/07/2020 08/17/2017  Falls in the past year? 0 0 0 0 No  Number falls in past yr: 0 0 0 0 -  Injury with Fall? 0 0 0 0 -  Risk for fall due to : No Fall Risks - - - -  Follow up Falls prevention discussed - - Falls evaluation completed;Education provided -   Normal gait and balance. Patient does not use assistive devices to ambulate.   Is the patient's home free of loose throw rugs in walkways, pet beds, electrical cords, etc?   yes      Grab bars in the bathroom? yes      Handrails on the stairs?   yes      Adequate lighting?   yes  Depression Screen PHQ 2/9 Scores 11/24/2021 01/26/2021 01/10/2021 04/26/2020  PHQ - 2 Score 0 0 0 0  PHQ- 9 Score - 0 0 0   Cognitive Function 6CIT Screen 11/24/2021  What Year? 0 points  What month? 0 points  What time? 0 points  Count back from 20 0 points  Months in reverse 0 points  Repeat phrase 0 points  Total Score 0   Immunization History   Administered Date(s) Administered   Influenza,inj,Quad PF,6+ Mos 07/27/2017, 08/27/2019, 08/02/2020   Influenza-Unspecified 08/03/2021   Moderna SARS-COV2 Booster Vaccination 08/13/2020   Moderna Sars-Covid-2 Vaccination 12/13/2019, 01/13/2020   Qualifies for Shingles Vaccine? Yes   Shingrix Completed: No, Education has been provided regarding the importance of this vaccine. Advised  may receive this vaccine at local pharmacy or Health Dept. Aware to provide a copy of the vaccination record if obtained from local pharmacy or Health Dept. Verbalized acceptance and understanding.   Screening Tests Health Maintenance  Topic Date Due   TETANUS/TDAP  Never done   Zoster Vaccines- Shingrix (1 of 2) Never done   Pneumonia Vaccine 38+ Years old (1 - PCV) Never done   DEXA SCAN  Never done   COVID-19 Vaccine (3 - Booster for Moderna series) 10/08/2020   COLONOSCOPY (Pts 45-89yrs Insurance coverage will need to be confirmed)  12/24/2022   MAMMOGRAM  02/22/2023   INFLUENZA VACCINE  Completed   Hepatitis C Screening  Completed   HPV VACCINES  Aged Out   Cancer Screenings: Lung: Low Dose CT Chest recommended if Age 33-80 years, 20 pack-year currently smoking OR have quit w/in 15years. Patient does not qualify. Breast:  Up to date on Mammogram? Yes   Up to date of Bone Density/Dexa? No Colorectal: UTD Cervical: Pap-02/01/2018 NIL HPV NEG  Additional Screenings: Hepatitis C Screening: Completed  HIV Screening: Completed   Plan:  [facility] apt 4/13. Bivalent booster due-plan to get at apt-declined today. DEXA due-can get this at next Mammogram apt.  I have personally reviewed and noted the following in the patients chart:   Medical and social history Use of alcohol, tobacco or illicit drugs  Current medications and supplements Functional ability and status Nutritional status Physical activity Advanced directives List of other physicians Hospitalizations, surgeries, and ER visits in  previous 12 months Vitals Screenings to include cognitive, depression, and falls Referrals and appointments  In addition, I have reviewed and discussed with patient certain preventive protocols, quality metrics, and best practice recommendations. A written personalized care plan for preventive services as well as general preventive health recommendations were provided to patient.  This visit was conducted virtually in the setting of the [place] pandemic.    Dorna Bloom, [place]  12/01/2021    I have reviewed this visit and agree with the documentation.

## 2021-12-01 NOTE — Patient Instructions (Signed)
You spoke to Deborah [name] [name], [place]  for your annual wellness visit.  We discussed goals:   Goals      Patient Stated     Control chronic back pain.        We also discussed recommended health maintenance. As discussed, you are due for: Health Maintenance  Topic Date Due   TETANUS/TDAP  Never done   Zoster Vaccines- Shingrix (1 of 2) Never done   Pneumonia Vaccine 42+ Years old (1 - PCV) Never done   DEXA SCAN  Never done   COVID-19 Vaccine (3 - Booster for Moderna series) 10/08/2020   COLONOSCOPY (Pts 45-28yr Insurance coverage will need to be confirmed)  12/24/2022   MAMMOGRAM  02/22/2023   INFLUENZA VACCINE  Completed   Hepatitis C Screening  Completed   HPV VACCINES  Aged Out   [facility]ivalent booster due-plan to get at apt-declined [name]. DEXA due-can get this at next Mammogram apt.  Preventive Care 655Years and Older, Female Preventive care refers to lifestyle choices and visits with your health care provider that can promote health and wellness. Preventive care visits are also called wellness exams. What can I expect for my preventive care visit? Counseling Your health care provider may ask you questions about your: Medical history, including: Past medical problems. Family medical history. Pregnancy and menstrual history. History of falls. Current health, including: Memory and ability to understand (cognition). Emotional well-being. Home life and relationship well-being. Sexual activity and sexual health. Lifestyle, including: Alcohol, nicotine or tobacco, and drug use. Access to firearms. Diet, exercise, and sleep habits. Work and work eStatistician Sunscreen use. Safety issues such as seatbelt and bike helmet use. Physical exam Your health care provider will check your: Height and weight. These may be used to calculate your BMI (body mass index). BMI is a measurement that tells if you are at a healthy weight. Waist circumference. This measures the  distance around your waistline. This measurement also tells if you are at a healthy weight and may help predict your risk of certain diseases, such as type 2 diabetes and high blood pressure. Heart rate and blood pressure. Body temperature. Skin for abnormal spots. What immunizations do I need? Vaccines are usually given at various ages, according to a schedule. Your health care provider will recommend vaccines for you based on your age, medical history, and lifestyle or other factors, such as travel or where you work. What tests do I need? Screening Your health care provider may recommend screening tests for certain conditions. This may include: Lipid and cholesterol levels. Hepatitis C test. Hepatitis B test. HIV (human immunodeficiency virus) test. STI (sexually transmitted infection) testing, if you are at risk. Lung cancer screening. Colorectal cancer screening. Diabetes screening. This is done by checking your blood sugar (glucose) after you have not eaten for a while (fasting). Mammogram. Talk with your health care provider about how often you should have regular mammograms. BRCA-related cancer screening. This may be done if you have a family history of breast, ovarian, tubal, or peritoneal cancers. Bone density scan. This is done to screen for osteoporosis. Talk with your health care provider about your test results, treatment options, and if necessary, the need for more tests. Follow these instructions at home: Eating and drinking  Eat a diet that includes fresh fruits and vegetables, whole grains, lean protein, and low-fat dairy products. Limit your intake of foods with high amounts of sugar, saturated fats, and salt. Take vitamin and mineral supplements  as recommended by your health care provider. Do not drink alcohol if your health care provider tells you not to drink. If you drink alcohol: Limit how much you have to 0-1 drink a day. Know how much alcohol is in your drink. In  the [place] one drink equals one 12 oz bottle of beer (355 mL), one 5 oz glass of wine (148 mL), or one 1 oz glass of hard liquor (44 mL). Lifestyle Brush your teeth every morning and night with fluoride toothpaste. Floss one time each day. Exercise for at least 30 minutes 5 or more days each week. Do not use any products that contain nicotine or tobacco. These products include cigarettes, chewing tobacco, and vaping devices, such as e-cigarettes. If you need help quitting, ask your health care provider. Do not use drugs. If you are sexually active, practice safe sex. Use a condom or other form of protection in order to prevent STIs. Take aspirin only as told by your health care provider. Make sure that you understand how much to take and what form to take. Work with your health care provider to find out whether it is safe and beneficial for you to take aspirin daily. Ask your health care provider if you need to take a cholesterol-lowering medicine (statin). Find healthy ways to manage stress, such as: Meditation, yoga, or listening to music. Journaling. Talking to a trusted person. Spending time with friends and family. Minimize exposure to UV radiation to reduce your risk of skin cancer. Safety Always wear your seat belt while driving or riding in a vehicle. Do not drive: If you have been drinking alcohol. Do not ride with someone who has been drinking. When you are tired or distracted. While texting. If you have been using any mind-altering substances or drugs. Wear a helmet and other protective equipment during sports activities. If you have firearms in your house, make sure you follow all gun safety procedures. What's next? Visit your health care provider once a year for an annual wellness visit. Ask your health care provider how often you should have your eyes and teeth checked. Stay up to date on all vaccines. This information is not intended to replace advice given to you by your  health care provider. Make sure you discuss any questions you have with your health care provider. Document Revised: 03/16/2021 Document Reviewed: 03/16/2021 Elsevier Patient Education  2022 Deborah [name] [name].

## 2022-01-01 ENCOUNTER — Other Ambulatory Visit: Payer: Self-pay | Admitting: Family Medicine

## 2022-01-01 DIAGNOSIS — I10 Essential (primary) hypertension: Secondary | ICD-10-CM

## 2022-01-12 ENCOUNTER — Encounter: Payer: Self-pay | Admitting: Family Medicine

## 2022-01-12 ENCOUNTER — Ambulatory Visit (INDEPENDENT_AMBULATORY_CARE_PROVIDER_SITE_OTHER): Payer: Medicare Other | Admitting: Family Medicine

## 2022-01-12 VITALS — BP 137/90 | HR 78 | Ht 60.0 in | Wt 133.0 lb

## 2022-01-12 DIAGNOSIS — Z13820 Encounter for screening for osteoporosis: Secondary | ICD-10-CM

## 2022-01-12 DIAGNOSIS — E782 Mixed hyperlipidemia: Secondary | ICD-10-CM | POA: Diagnosis not present

## 2022-01-12 DIAGNOSIS — Z131 Encounter for screening for diabetes mellitus: Secondary | ICD-10-CM | POA: Diagnosis not present

## 2022-01-12 DIAGNOSIS — Z780 Asymptomatic menopausal state: Secondary | ICD-10-CM

## 2022-01-12 DIAGNOSIS — Z1231 Encounter for screening mammogram for malignant neoplasm of breast: Secondary | ICD-10-CM

## 2022-01-12 DIAGNOSIS — I10 Essential (primary) hypertension: Secondary | ICD-10-CM | POA: Diagnosis not present

## 2022-01-12 DIAGNOSIS — Z0000 Encounter for general adult medical examination without abnormal findings: Secondary | ICD-10-CM

## 2022-01-12 DIAGNOSIS — Z13220 Encounter for screening for lipoid disorders: Secondary | ICD-10-CM | POA: Diagnosis not present

## 2022-01-12 LAB — POCT GLYCOSYLATED HEMOGLOBIN (HGB A1C): HbA1c, POC (prediabetic range): 5.8 % (ref 5.7–6.4)

## 2022-01-12 MED ORDER — TETANUS-DIPHTH-ACELL PERTUSSIS 5-2.5-18.5 LF-MCG/0.5 IM SUSP: 0.5000 mL | Freq: Once | INTRAMUSCULAR | 0 refills | Status: AC

## 2022-01-12 MED ORDER — ZOSTER VAC RECOMB ADJUVANTED 50 MCG/0.5ML IM SUSR: 0.5000 mL | Freq: Once | INTRAMUSCULAR | 0 refills | Status: AC

## 2022-01-12 NOTE — Progress Notes (Signed)
? ? ?SUBJECTIVE:  ? ?Chief compliant/HPI: annual examination ? ?Juniper Gielow is a 66 y.o. who presents today for an annual exam.  ? ?Hypertension: BP today is not at goal: 137/90. Valsartan-HCTZ 320-25 mg qd. Patient reports that she takes her blood pressure at home every day and it is usually 120s/70s, not usually in the 130s.  ? ?History tabs reviewed and updated.  ? ?Review of systems form reviewed and not notable. She reports that her back pain is under control today.  ? ?OBJECTIVE:  ? ?BP 137/90   Pulse 78   Ht 5' (1.524 m)   Wt 133 lb (60.3 kg)   SpO2 100%   BMI 25.97 kg/m?   ?Nursing note and vitals reviewed ?GEN: age-appropriate, [place] Woman, resting comfortably in chair, NAD, WNWD ?HEENT: NCAT. PERRLA. Sclera without injection or icterus. MMM.  ?Neck: Supple. No LAD ?Cardiac: Regular rate and rhythm. Normal S1/S2. No murmurs, rubs, or gallops appreciated. 2+ radial pulses. ?Lungs: Clear bilaterally to ascultation. No increased WOB, no accessory muscle usage. No w/r/r. ?Neuro: AOx3  ?Ext: no edema ?Psych: Pleasant and appropriate  ? ?ASSESSMENT/PLAN:  ? ?Essential hypertension ?Chronic, close to goal. Per report, is at goal with home readings, which are most accurate. Continue valsartan-hctz current regimen. CMP today shows normal cr and electrolytes. Follow up 6 months. ? ?Hyperlipidemia ?ASCVD risk intermediate at 9.6%. Total cholesterol 184, HDL 45, LDL 127. Will recommend starting a moderate intensity statin such as rosuvastatin 10 mg. Will discuss with patient. ?  ? ?  01/12/2022  ?  3:55 PM 11/24/2021  ?  4:03 PM 01/26/2021  ?  4:09 PM 01/10/2021  ?  3:53 PM 04/26/2020  ?  3:38 PM  ?Depression screen PHQ 2/9  ?Decreased Interest 0 0 0 0 0  ?Down, Depressed, Hopeless 0 0 0 0 0  ?PHQ - 2 Score 0 0 0 0 0  ?Altered sleeping 0  0 0 0  ?Tired, decreased energy 0  0 0 0  ?Change in appetite 0  0 0 0  ?Feeling bad or failure about yourself  0  0 0 0  ?Trouble concentrating 0  0 0 0  ?Moving  slowly or fidgety/restless 0  0 0 0  ?Suicidal thoughts 0  0 0 0  ?PHQ-9 Score 0  0 0 0  ?Difficult doing work/chores Not difficult at all  Not difficult at all    ? ?Annual Examination  ?See AVS for age appropriate recommendations  ?PHQ score 0, reviewed and discussed.  ?BP reviewed and at goal: home BP appropriate.  ?Asked about intimate partner violence, feels safe in relationship. ? ?Considered the following items based upon USPSTF recommendations: ?Diabetes screening: discussed and ordered ?Screening for elevated cholesterol: discussed and ordered ?HIV testing:  not ordered, not high risk ?Hepatitis C:  not ordered, not high risk ?Hepatitis B:  not ordered, not high risk ?Syphilis if at high risk:  not ordered, not high risk ?GC/CT not at high risk and not ordered. ?DEXA ordered.  ?Reviewed risk factors for latent tuberculosis and not indicated ? ? ?Discussed family history, BRCA testing not indicated.  ?Cervical cancer screening: last and only pap smear in Epic is 2019, NILM, HPV negative. Patient reports that her mother died from cervical cancer that she was diagnosed with at age 53-70. Patient would feel better if she had one more pap smear, which is reasonable. She is due for pap in May 2024. ?Breast cancer screening:  UTD with mammogram, repeat  due May 2023 ?Colorectal cancer screening: up to date on screening for CRC. She had pre-cancerous polyps removed, next colonoscopy due ?Lung cancer screening:  does not qualify . See documentation below regarding indications/risks/benefits.  ?Vaccinations discussed pneumococcal vaccination, which she will consider. Given rx for TDAP and Zoster.  ? ?Follow up in 1 year or sooner if indicated.  ? ? ?Gladys Damme, MD ?[place]  ? ?

## 2022-01-12 NOTE — Patient Instructions (Addendum)
It was a pleasure to see you today! ? ?We will get some labs today.  If they are abnormal or we need to do something about them, I will call you.  If they are normal, I will send you a message on MyChart (if it is active) or a letter in the mail.  If you don't hear from [place] in 2 weeks, please call the office  [phone]. ?Keep doing a great job checking your blood pressure. If your average blood pressure is less than 135/85, no worries. If you notice your average blood pressure is above this, follow up as you may need more medication. ?Schedule your mammogram and dexa scan for May 2023 ?Consider getting the pneumonia vaccine ?Discuss with your insurance exam as it is reasonable to perform one more pap smear. This would next be due in May 2024. ? ? ?Be Well, ? ?Dr. Chauncey Reading ? ?

## 2022-01-13 LAB — COMPREHENSIVE METABOLIC PANEL
ALT: 18 IU/L (ref 0–32)
AST: 17 IU/L (ref 0–40)
Albumin/Globulin Ratio: 2.5 — ABNORMAL HIGH (ref 1.2–2.2)
Albumin: 4.9 g/dL — ABNORMAL HIGH (ref 3.8–4.8)
Alkaline Phosphatase: 71 IU/L (ref 44–121)
BUN/Creatinine Ratio: 12 (ref 12–28)
BUN: 12 mg/dL (ref 8–27)
Bilirubin Total: 0.5 mg/dL (ref 0.0–1.2)
CO2: 23 mmol/L (ref 20–29)
Calcium: 9.7 mg/dL (ref 8.7–10.3)
Chloride: 105 mmol/L (ref 96–106)
Creatinine, Ser: 0.97 mg/dL (ref 0.57–1.00)
Globulin, Total: 2 g/dL (ref 1.5–4.5)
Glucose: 94 mg/dL (ref 70–99)
Potassium: 4.5 mmol/L (ref 3.5–5.2)
Sodium: 143 mmol/L (ref 134–144)
Total Protein: 6.9 g/dL (ref 6.0–8.5)
eGFR: 64 mL/min/{1.73_m2} (ref >59)

## 2022-01-13 LAB — LIPID PANEL
Chol/HDL Ratio: 4.1 ratio (ref 0.0–4.4)
Cholesterol, Total: 184 mg/dL (ref 100–199)
HDL: 45 mg/dL (ref >39)
LDL Chol Calc (NIH): 127 mg/dL — ABNORMAL HIGH (ref 0–99)
Triglycerides: 61 mg/dL (ref 0–149)
VLDL Cholesterol Cal: 12 mg/dL (ref 5–40)

## 2022-01-14 NOTE — Assessment & Plan Note (Signed)
ASCVD risk intermediate at 9.6%. Total cholesterol 184, HDL 45, LDL 127. Will recommend starting a moderate intensity statin such as rosuvastatin 10 mg. Will discuss with patient. ?

## 2022-01-14 NOTE — Assessment & Plan Note (Signed)
Chronic, close to goal. Per report, is at goal with home readings, which are most accurate. Continue valsartan-hctz current regimen. CMP today shows normal cr and electrolytes. Follow up 6 months. ?

## 2022-02-23 ENCOUNTER — Ambulatory Visit
Admission: RE | Admit: 2022-02-23 | Discharge: 2022-02-23 | Disposition: A | Discharge status: Home / Self Care | Payer: Medicare Other | Source: Ambulatory Visit | Attending: Family Medicine | Admitting: Family Medicine

## 2022-02-23 DIAGNOSIS — Z1231 Encounter for screening mammogram for malignant neoplasm of breast: Secondary | ICD-10-CM

## 2022-03-07 ENCOUNTER — Encounter: Payer: Self-pay | Admitting: *Deleted

## 2022-06-27 ENCOUNTER — Telehealth: Payer: Self-pay

## 2022-06-27 NOTE — Telephone Encounter (Signed)
Patient calls nurse line requesting scabies treatment.   Patient reports her daughter was recently diagnosed with scabies. She reports she has seen her daughter over several occasions most recently this past weekend.   She reports she has been experiencing itching "all over her body." She reports she has been using tea tree oil with no relief. When asked if she had noticeable areas on her skin she stated no.   Patient advised to schedule an apt for evaluation. However, she stated "I do not want to come in there and I do not ask for much please just treat me."   Will forward to PCP.

## 2022-06-29 NOTE — Telephone Encounter (Signed)
Attempted to call patient regarding recent call regarding possible scabies infection. Unable to leave voicemail as patient phone mailbox was full. Previously sent message in myChart.

## 2022-06-29 NOTE — Telephone Encounter (Signed)
Patient returns call to nurse line.   Patient scheduled for tomorrow afternoon in ATC.

## 2022-06-30 ENCOUNTER — Other Ambulatory Visit: Payer: Self-pay

## 2022-06-30 ENCOUNTER — Other Ambulatory Visit: Payer: Self-pay | Admitting: Family Medicine

## 2022-06-30 ENCOUNTER — Ambulatory Visit: Payer: Medicare Other

## 2022-06-30 DIAGNOSIS — I10 Essential (primary) hypertension: Secondary | ICD-10-CM

## 2022-06-30 MED ORDER — VALSARTAN-HYDROCHLOROTHIAZIDE 320-25 MG PO TABS: 1.0000 | ORAL_TABLET | Freq: Every day | ORAL | 0 refills | Status: DC

## 2022-07-05 ENCOUNTER — Ambulatory Visit
Admission: RE | Admit: 2022-07-05 | Discharge: 2022-07-05 | Disposition: A | Discharge status: Home / Self Care | Payer: Medicare Other | Source: Ambulatory Visit | Attending: Family Medicine | Admitting: Family Medicine

## 2022-07-05 DIAGNOSIS — Z13820 Encounter for screening for osteoporosis: Secondary | ICD-10-CM

## 2022-07-05 DIAGNOSIS — M8589 Other specified disorders of bone density and structure, multiple sites: Secondary | ICD-10-CM | POA: Diagnosis not present

## 2022-07-05 DIAGNOSIS — Z780 Asymptomatic menopausal state: Secondary | ICD-10-CM | POA: Diagnosis not present

## 2022-08-07 ENCOUNTER — Encounter: Payer: Self-pay | Admitting: Family Medicine

## 2022-08-07 ENCOUNTER — Telehealth: Payer: Self-pay

## 2022-08-07 ENCOUNTER — Ambulatory Visit (INDEPENDENT_AMBULATORY_CARE_PROVIDER_SITE_OTHER): Payer: Medicare Other | Admitting: Family Medicine

## 2022-08-07 VITALS — BP 132/70 | HR 106 | Ht 60.0 in | Wt 142.4 lb

## 2022-08-07 DIAGNOSIS — N3001 Acute cystitis with hematuria: Secondary | ICD-10-CM | POA: Diagnosis not present

## 2022-08-07 DIAGNOSIS — R300 Dysuria: Secondary | ICD-10-CM

## 2022-08-07 DIAGNOSIS — N390 Urinary tract infection, site not specified: Secondary | ICD-10-CM | POA: Insufficient documentation

## 2022-08-07 MED ORDER — CEPHALEXIN 500 MG PO CAPS: 500.0000 mg | ORAL_CAPSULE | Freq: Two times a day (BID) | ORAL | 0 refills | Status: AC

## 2022-08-07 NOTE — Assessment & Plan Note (Signed)
Symptoms consistent with urinary tract infection.  Unfortunately we are unable to do point-of-care UA today given that she has been on Azo.  Given her symptoms we will treat empirically. Will follow-up urine culture and adjust treatment as necessary. No red flags or symptoms consistent with renal stone or pyelonephritis.

## 2022-08-07 NOTE — Patient Instructions (Addendum)
It was wonderful to see you today.  Please bring ALL of your medications with you to every visit.   Today we talked about:  We are doing lab work today to check your urine to see if you have an infection. I will send you a MyChart message if you have MyChart. Otherwise, I will give you a call for abnormal results or send a letter if everything returned back normal. If you don't hear from me in 2 weeks, please call the office.    I have sent in antibiotics. Take it twice a day for 7 days.  Thank you for coming to your visit as scheduled. We have had a large "no-show" problem lately, and this significantly limits our ability to see and care for patients. As a friendly reminder- if you cannot make your appointment please call to cancel. We do have a no show policy for those who do not cancel within 24 hours. Our policy is that if you miss or fail to cancel an appointment within 24 hours, 3 times in a 69-monthperiod, you may be dismissed from our clinic.   Thank you for choosing [facility]   Please call [phone] any questions about today's appointment.  Please be sure to schedule follow up at the front  desk before you leave today.   ASharion Settler DO PGY-3 Family Medicine

## 2022-08-07 NOTE — Telephone Encounter (Signed)
Patient calls nurse line reporting UTI symptoms.  Patient reports symptoms started on Thursday with dysuria and hematuria. Patient reports she started taking OTC Azo with some relief. She reports she has been drinking cranberry juice and trying to stay hydrated.   She reports this morning she woke up with abdominal pain and generally not feeling well. She denies any fevers or chills.   Patient scheduled for this afternoon for evaluation.

## 2022-08-07 NOTE — Progress Notes (Signed)
    SUBJECTIVE:   CHIEF COMPLAINT / HPI:   Deborah [name] is a 66 y.o. female who presents to the [facility] clinic today to discuss the following concerns:   UTI Symptoms Her symptoms started on Thursday 11/2. She has experienced some burning, urinary frequency and saw some blood in her urine. She has not had a UTI in many years. She is taking Azo for her symptoms. No fevers, nausea/vomiting. She notes that she has chronic back pain but she does not typically need to take medications for it.   PERTINENT  PMH / PSH: HTN, lumbar radiculopathy  OBJECTIVE:   BP 132/70   Pulse (!) 106   Ht 5' (1.524 m)   Wt 142 lb 6.4 oz (64.6 kg)   SpO2 95%   BMI 27.81 kg/m    General: NAD, pleasant, able to participate in exam Respiratory: normal effort Abdomen: Bowel sounds present, nontender, non-distended Back: No CVAT  Psych: Normal affect and mood  ASSESSMENT/PLAN:   UTI (urinary tract infection) Symptoms consistent with urinary tract infection.  Unfortunately we are unable to do point-of-care UA today given that she has been on Azo.  Given her symptoms we will treat empirically. Will follow-up urine culture and adjust treatment as necessary. No red flags or symptoms consistent with renal stone or pyelonephritis.     Sharion Settler, [place]

## 2022-08-08 LAB — URINALYSIS, ROUTINE W REFLEX MICROSCOPIC
Bilirubin, UA: NEGATIVE
Glucose, UA: NEGATIVE
Ketones, UA: NEGATIVE
Nitrite, UA: POSITIVE — AB
Protein,UA: NEGATIVE
Specific Gravity, UA: 1.006 (ref 1.005–1.030)
Urobilinogen, Ur: 1 mg/dL (ref 0.2–1.0)
pH, UA: 6 (ref 5.0–7.5)

## 2022-08-08 LAB — MICROSCOPIC EXAMINATION: Casts: NONE SEEN /lpf

## 2022-08-09 LAB — URINE CULTURE

## 2022-09-20 ENCOUNTER — Other Ambulatory Visit: Payer: Self-pay

## 2022-09-20 DIAGNOSIS — I10 Essential (primary) hypertension: Secondary | ICD-10-CM

## 2022-09-22 ENCOUNTER — Other Ambulatory Visit: Payer: Self-pay | Admitting: Family Medicine

## 2022-09-22 DIAGNOSIS — I10 Essential (primary) hypertension: Secondary | ICD-10-CM

## 2022-09-22 MED ORDER — VALSARTAN-HYDROCHLOROTHIAZIDE 320-25 MG PO TABS: 1.0000 | ORAL_TABLET | Freq: Every day | ORAL | 0 refills | Status: DC

## 2022-09-22 NOTE — Telephone Encounter (Signed)
Patient returns call to nurse line. She is requesting refill prior to our office being closed for the weekend/holiday.   Forwarding to PCP.   Talbot Grumbling, RN

## 2022-10-18 ENCOUNTER — Other Ambulatory Visit: Payer: Self-pay | Admitting: Family Medicine

## 2022-10-18 ENCOUNTER — Other Ambulatory Visit: Payer: Self-pay

## 2022-10-18 DIAGNOSIS — I10 Essential (primary) hypertension: Secondary | ICD-10-CM

## 2022-10-18 MED ORDER — VALSARTAN-HYDROCHLOROTHIAZIDE 320-25 MG PO TABS: 1.0000 | ORAL_TABLET | Freq: Every day | ORAL | 0 refills | Status: DC

## 2022-11-22 ENCOUNTER — Other Ambulatory Visit: Payer: Self-pay

## 2022-11-22 DIAGNOSIS — I10 Essential (primary) hypertension: Secondary | ICD-10-CM

## 2022-11-23 ENCOUNTER — Other Ambulatory Visit: Payer: Self-pay | Admitting: Family Medicine

## 2022-11-23 DIAGNOSIS — I10 Essential (primary) hypertension: Secondary | ICD-10-CM

## 2022-11-23 MED ORDER — VALSARTAN-HYDROCHLOROTHIAZIDE 320-25 MG PO TABS: 1.0000 | ORAL_TABLET | Freq: Every day | ORAL | 0 refills | Status: DC

## 2022-12-08 ENCOUNTER — Telehealth: Payer: Self-pay

## 2022-12-08 ENCOUNTER — Other Ambulatory Visit: Payer: Self-pay | Admitting: Family Medicine

## 2022-12-08 DIAGNOSIS — I10 Essential (primary) hypertension: Secondary | ICD-10-CM

## 2022-12-08 MED ORDER — VALSARTAN-HYDROCHLOROTHIAZIDE 320-25 MG PO TABS: 1.0000 | ORAL_TABLET | Freq: Every day | ORAL | 0 refills | Status: DC

## 2022-12-08 NOTE — Telephone Encounter (Signed)
Patient has been scheduled for 4/4 with PCP.   Please send in a 30 day supply to get her to this apt.

## 2023-01-04 ENCOUNTER — Encounter: Payer: Self-pay | Admitting: Family Medicine

## 2023-01-04 ENCOUNTER — Ambulatory Visit (INDEPENDENT_AMBULATORY_CARE_PROVIDER_SITE_OTHER): Payer: Medicare Other | Admitting: Family Medicine

## 2023-01-04 VITALS — BP 128/70 | HR 74 | Ht 60.0 in | Wt 135.0 lb

## 2023-01-04 DIAGNOSIS — D126 Benign neoplasm of colon, unspecified: Secondary | ICD-10-CM | POA: Diagnosis not present

## 2023-01-04 DIAGNOSIS — I10 Essential (primary) hypertension: Secondary | ICD-10-CM

## 2023-01-04 MED ORDER — VALSARTAN-HYDROCHLOROTHIAZIDE 320-25 MG PO TABS: 1.0000 | ORAL_TABLET | Freq: Every day | ORAL | 0 refills | Status: DC

## 2023-01-04 NOTE — Assessment & Plan Note (Signed)
BP controlled on Diovan. At goal in clinic, and in BP log patient brings in. Continue current regimen.

## 2023-01-04 NOTE — Progress Notes (Signed)
    SUBJECTIVE:   CHIEF COMPLAINT / HPI: BP follow-up  BP - Measures at home. Around 110s/70s in BP log. Continues to take Diovan.  Colonoscopy - No blood in stool. Was told she would require earlier screening. Per chart review she is due this year. Does endorse some increased fatigue with activity.   PERTINENT  PMH / PSH: HTN, HLD  OBJECTIVE:   BP 128/70   Pulse 74   Ht 5' (1.524 m)   Wt 135 lb (61.2 kg)   SpO2 98%   BMI 26.37 kg/m   General: NAD  Neuro: A&O Respiratory: normal WOB on RA Extremities: Moving all 4 extremities equally   ASSESSMENT/PLAN:   Essential hypertension Assessment & Plan: BP controlled on Diovan. At goal in clinic, and in BP log patient brings in. Continue current regimen.   Orders: -     Valsartan-hydroCHLOROthiazide; Take 1 tablet by mouth daily. MAKE APPT PRIOR TO NEXT REFILL  Dispense: 90 tablet; Refill: 0  Tubular adenoma of colon Assessment & Plan: Due for repeat colonoscopy. Patient given instruction to call [facility] for repeat.    Return in about 1 month (around 02/03/2023).  Salvadore Oxford, MD [place]

## 2023-01-04 NOTE — Patient Instructions (Addendum)
It was great to see you! Thank you for allowing me to participate in your care!  I recommend that you always bring your medications to each appointment as this makes it easy to ensure we are on the correct medications and helps [place] not miss when refills are needed.  Our plans for today:  - I have refilled your Diovan and sent to your pharmacy, please let me know if your BP is increasing. - Please call [facility] to schedule your repeat colonoscopy at this number [phone].   Please arrive 15 minutes PRIOR to your next scheduled appointment time! If you do not, this affects OTHER patients' care.  Take care and seek immediate care sooner if you develop any concerns.   Dr. Salvadore Oxford, MD [place]

## 2023-01-04 NOTE — Assessment & Plan Note (Signed)
Due for repeat colonoscopy. Patient given instruction to call [facility] for repeat.

## 2023-01-17 ENCOUNTER — Encounter: Payer: Self-pay | Admitting: Gastroenterology

## 2023-01-26 ENCOUNTER — Telehealth: Payer: Self-pay | Admitting: Family Medicine

## 2023-01-26 NOTE — Telephone Encounter (Signed)
Called patient to schedule Medicare Annual Wellness Visit (AWV). Left message for patient to call back and schedule Medicare Annual Wellness Visit (AWV).  Last date of AWV: 11/24/2021   Please schedule an AWVS appointment at any time with [facility] ANNUAL WELLNESS VISIT.  If any questions, please contact me at [phone].    Thank you,  Aveah Castell  [facility] Direct dial  [phone]   

## 2023-01-29 ENCOUNTER — Other Ambulatory Visit: Payer: Self-pay | Admitting: Family Medicine

## 2023-01-29 DIAGNOSIS — Z1231 Encounter for screening mammogram for malignant neoplasm of breast: Secondary | ICD-10-CM

## 2023-02-19 ENCOUNTER — Ambulatory Visit (AMBULATORY_SURGERY_CENTER): Payer: Medicare Other | Admitting: *Deleted

## 2023-02-19 VITALS — Ht 60.0 in | Wt 136.0 lb

## 2023-02-19 DIAGNOSIS — Z86010 Personal history of colonic polyps: Secondary | ICD-10-CM

## 2023-02-19 MED ORDER — NA SULFATE-K SULFATE-MG SULF 17.5-3.13-1.6 GM/177ML PO SOLN: 1.0000 | Freq: Once | ORAL | 0 refills | Status: AC

## 2023-02-19 NOTE — Progress Notes (Signed)
Pt's name and DOB verified at the beginning of the pre-visit.  Pt denies any difficulty with ambulating,sitting, laying down or rolling side to side Gave both [facility] main # and MD on call # prior to instructions.  No egg or soy allergy known to patient  No issues known to pt with past sedation with any surgeries or procedures Pt denies having issues being intubated Pt has no issues moving head neck or swallowing No FH of Malignant Hyperthermia Pt is not on diet pills Pt is not on home 02  Pt is not on blood thinners  Pt denies issues with constipation  Pt is not on dialysis Pt denise any abnormal heart rhythms  Pt denies any upcoming cardiac testing Pt encouraged to use to use Singlecare or Goodrx to reduce cost  Patient's chart reviewed by Deborah [name] CNRA prior to pre-visit and patient appropriate for the [facility].  Pre-visit completed and red dot placed by patient's name on their procedure day (on provider's schedule).  . Visit by phone Pt states weight is 136lb Instructed pt why it is important to and  to call if they have any changes in health or new medications. Directed them to the # given and on instructions.   Pt states they will.  Instructions reviewed with pt and pt states understanding. Instructed to review again prior to procedure. Pt states they will.  Instructions sent by mail with coupon and by my chart

## 2023-03-01 ENCOUNTER — Ambulatory Visit
Admission: RE | Admit: 2023-03-01 | Discharge: 2023-03-01 | Disposition: A | Discharge status: Home / Self Care | Payer: Medicare Other | Source: Ambulatory Visit | Attending: Family Medicine | Admitting: Family Medicine

## 2023-03-01 DIAGNOSIS — Z1231 Encounter for screening mammogram for malignant neoplasm of breast: Secondary | ICD-10-CM | POA: Diagnosis not present

## 2023-03-06 ENCOUNTER — Ambulatory Visit (INDEPENDENT_AMBULATORY_CARE_PROVIDER_SITE_OTHER): Payer: Medicare Other | Admitting: *Deleted

## 2023-03-06 DIAGNOSIS — Z0000 Encounter for general adult medical examination without abnormal findings: Secondary | ICD-10-CM | POA: Diagnosis not present

## 2023-03-06 NOTE — Patient Instructions (Signed)
Deborah [name] , Thank you for taking time to come for your Medicare Wellness Visit. I appreciate your ongoing commitment to your health goals. Please review the following plan we discussed and let me know if I can assist you in the future.   Screening recommendations/referrals: Colonoscopy: scheduled Mammogram: up to date Bone Density: up to date Recommended yearly ophthalmology/optometry visit for glaucoma screening and checkup Recommended yearly dental visit for hygiene and checkup  Vaccinations: Influenza vaccine: Education provided Pneumococcal vaccine: up to date Tdap vaccine: up to date Shingles vaccine: up to date    Advanced directives: Education provided     Preventive Care 65 Years and Older, Female Preventive care refers to lifestyle choices and visits with your health care provider that can promote health and wellness. What does preventive care include? A yearly physical exam. This is also called an annual well check. Dental exams once or twice a year. Routine eye exams. Ask your health care provider how often you should have your eyes checked. Personal lifestyle choices, including: Daily care of your teeth and gums. Regular physical activity. Eating a healthy diet. Avoiding tobacco and drug use. Limiting alcohol use. Practicing safe sex. Taking low-dose aspirin every day. Taking vitamin and mineral supplements as recommended by your health care provider. What happens during an annual well check? The services and screenings done by your health care provider during your annual well check will depend on your age, overall health, lifestyle risk factors, and family history of disease. Counseling  Your health care provider may ask you questions about your: Alcohol use. Tobacco use. Drug use. Emotional well-being. Home and relationship well-being. Sexual activity. Eating habits. History of falls. Memory and ability to understand (cognition). Work and work  Astronomer. Reproductive health. Screening  You may have the following tests or measurements: Height, weight, and BMI. Blood pressure. Lipid and cholesterol levels. These may be checked every 5 years, or more frequently if you are over 4 years old. Skin check. Lung cancer screening. You may have this screening every year starting at age 50 if you have a 30-pack-year history of smoking and currently smoke or have quit within the past 15 years. Fecal occult blood test (FOBT) of the stool. You may have this test every year starting at age 84. Flexible sigmoidoscopy or colonoscopy. You may have a sigmoidoscopy every 5 years or a colonoscopy every 10 years starting at age 66. Hepatitis C blood test. Hepatitis B blood test. Sexually transmitted disease (STD) testing. Diabetes screening. This is done by checking your blood sugar (glucose) after you have not eaten for a while (fasting). You may have this done every 1-3 years. Bone density scan. This is done to screen for osteoporosis. You may have this done starting at age 79. Mammogram. This may be done every 1-2 years. Talk to your health care provider about how often you should have regular mammograms. Talk with your health care provider about your test results, treatment options, and if necessary, the need for more tests. Vaccines  Your health care provider may recommend certain vaccines, such as: Influenza vaccine. This is recommended every year. Tetanus, diphtheria, and acellular pertussis (Tdap, Td) vaccine. You may need a Td booster every 10 years. Zoster vaccine. You may need this after age 55. Pneumococcal 13-valent conjugate (PCV13) vaccine. One dose is recommended after age 50. Pneumococcal polysaccharide (PPSV23) vaccine. One dose is recommended after age 5. Talk to your health care provider about which screenings and vaccines you need and how often you  need them. This information is not intended to replace advice given to you by  your health care provider. Make sure you discuss any questions you have with your health care provider. Document Released: 10/15/2015 Document Revised: 06/07/2016 Document Reviewed: 07/20/2015 Elsevier Interactive Patient Education  2017 [employer].  Fall Prevention in the Home Falls can cause injuries. They can happen to people of all ages. There are many things you can do to make your home safe and to help prevent falls. What can I do on the outside of my home? Regularly fix the edges of walkways and driveways and fix any cracks. Remove anything that might make you trip as you walk through a door, such as a raised step or threshold. Trim any bushes or trees on the path to your home. Use bright outdoor lighting. Clear any walking paths of anything that might make someone trip, such as rocks or tools. Regularly check to see if handrails are loose or broken. Make sure that both sides of any steps have handrails. Any raised decks and porches should have guardrails on the edges. Have any leaves, snow, or ice cleared regularly. Use sand or salt on walking paths during winter. Clean up any spills in your garage right away. This includes oil or grease spills. What can I do in the bathroom? Use night lights. Install grab bars by the toilet and in the tub and shower. Do not use towel bars as grab bars. Use non-skid mats or decals in the tub or shower. If you need to sit down in the shower, use a plastic, non-slip stool. Keep the floor dry. Clean up any water that spills on the floor as soon as it happens. Remove soap buildup in the tub or shower regularly. Attach bath mats securely with double-sided non-slip rug tape. Do not have throw rugs and other things on the floor that can make you trip. What can I do in the bedroom? Use night lights. Make sure that you have a light by your bed that is easy to reach. Do not use any sheets or blankets that are too big for your bed. They should not hang  down onto the floor. Have a firm chair that has side arms. You can use this for support while you get dressed. Do not have throw rugs and other things on the floor that can make you trip. What can I do in the kitchen? Clean up any spills right away. Avoid walking on wet floors. Keep items that you use a lot in easy-to-reach places. If you need to reach something above you, use a strong step stool that has a grab bar. Keep electrical cords out of the way. Do not use floor polish or wax that makes floors slippery. If you must use wax, use non-skid floor wax. Do not have throw rugs and other things on the floor that can make you trip. What can I do with my stairs? Do not leave any items on the stairs. Make sure that there are handrails on both sides of the stairs and use them. Fix handrails that are broken or loose. Make sure that handrails are as long as the stairways. Check any carpeting to make sure that it is firmly attached to the stairs. Fix any carpet that is loose or worn. Avoid having throw rugs at the top or bottom of the stairs. If you do have throw rugs, attach them to the floor with carpet tape. Make sure that you have a light switch  at the top of the stairs and the bottom of the stairs. If you do not have them, ask someone to add them for you. What else can I do to help prevent falls? Wear shoes that: Do not have high heels. Have rubber bottoms. Are comfortable and fit you well. Are closed at the toe. Do not wear sandals. If you use a stepladder: Make sure that it is fully opened. Do not climb a closed stepladder. Make sure that both sides of the stepladder are locked into place. Ask someone to hold it for you, if possible. Clearly mark and make sure that you can see: Any grab bars or handrails. First and last steps. Where the edge of each step is. Use tools that help you move around (mobility aids) if they are needed. These  include: Canes. Walkers. Scooters. Crutches. Turn on the lights when you go into a dark area. Replace any light bulbs as soon as they burn out. Set up your furniture so you have a clear path. Avoid moving your furniture around. If any of your floors are uneven, fix them. If there are any pets around you, be aware of where they are. Review your medicines with your doctor. Some medicines can make you feel dizzy. This can increase your chance of falling. Ask your doctor what other things that you can do to help prevent falls. This information is not intended to replace advice given to you by your health care provider. Make sure you discuss any questions you have with your health care provider. Document Released: 07/15/2009 Document Revised: 02/24/2016 Document Reviewed: 10/23/2014 Elsevier Interactive Patient Education  2017 [employer].

## 2023-03-06 NOTE — Progress Notes (Signed)
Subjective:   Deborah [name] is a 67 y.o. female who presents for Medicare Annual (Subsequent) preventive examination.  I connected with  Roshanna Ragen on 03/06/23 by a telephone enabled telemedicine application and verified that I am speaking with the correct person using two identifiers.   I discussed the limitations of evaluation and management by telemedicine. The patient expressed understanding and agreed to proceed.  Patient location: home  Provider location: telephone/home   Review of Systems     Cardiac Risk Factors include: advanced age (>83men, >33 women);hypertension;sedentary lifestyle     Objective:    There were no vitals filed for this visit. There is no height or weight on file to calculate BMI.     03/06/2023    9:08 AM 01/04/2023    4:32 PM 08/07/2022    2:53 PM 12/01/2021   11:38 AM 01/10/2021    3:53 PM 05/10/2020    4:00 PM 04/26/2020    3:37 PM  Advanced Directives  Does Patient Have a Medical Advance Directive? No No No No No No No  Would patient like information on creating a medical advance directive? No - Patient declined No - Patient declined  No - Patient declined No - Patient declined Yes (MAU/Ambulatory/Procedural Areas - Information given) No - Patient declined    Current Medications (verified) Outpatient Encounter Medications as of 03/06/2023  Medication Sig   Ascorbic Acid (VITAMIN C) 1000 MG tablet Take 1,000 mg by mouth daily.   cholecalciferol (VITAMIN D3) 25 MCG (1000 UNIT) tablet Take 1,000 Units by mouth daily.   Cyanocobalamin (VITAMIN B 12 PO) Take by mouth.   Magnesium 400 MG CAPS Take by mouth.   Multiple Vitamins-Minerals (ZINC PO) Take by mouth.   Omega-3 Fatty Acids (FISH OIL) 300 MG CAPS Take by mouth.   valsartan-hydrochlorothiazide (DIOVAN-HCT) 320-25 MG tablet Take 1 tablet by mouth daily. MAKE APPT PRIOR TO NEXT REFILL   vitamin E 180 MG (400 UNITS) capsule Take 400 Units by mouth daily.   Zinc Sulfate (ZINC 15 PO) Take  by mouth.   Ibuprofen (ADVIL PO) Take by mouth. (Patient not taking: Reported on 01/04/2023)   No facility-administered encounter medications on file as of 03/06/2023.    Allergies (verified) Pork-derived products   History: Past Medical History:  Diagnosis Date   Arthritis    Hypertension    Past Surgical History:  Procedure Laterality Date   carpel tunnel Right 2005   CESAREAN SECTION  05/14/1993   COLONOSCOPY     ~10 yrs ago- normal per pt- in [place]    Family History  Problem Relation Age of Onset   Ovarian cancer Mother    Stroke Father    Breast cancer Sister    Colon polyps Sister    Stroke Brother        in his early thirties   Colon cancer Neg Hx    Esophageal cancer Neg Hx    Rectal cancer Neg Hx    Stomach cancer Neg Hx    Social History   Socioeconomic History   Marital status: Married    Spouse name: [name]   Number of children: 2   Years of education: Not on file   Highest education level: Not on file  Occupational History   Occupation: [employer]   Tobacco Use   Smoking status: Never    Passive exposure: Past   Smokeless tobacco: Never  Vaping Use   Vaping Use: Never used  Substance and Sexual  Activity   Alcohol use: Never   Drug use: Never   Sexual activity: Yes    Partners: Male    Birth control/protection: Post-menopausal    Comment: Husband   Other Topics Concern   Not on file  Social History Narrative   Patient lives with her husband [place].    Patient has two children.    Patient helps with her granddaughter.    Patient has chronic back pain which limits her exercise.       Social Determinants of Health   Financial Resource Strain: Low Risk  (03/06/2023)   Overall Financial Resource Strain (CARDIA)    Difficulty of Paying Living Expenses: Not hard at all  Food Insecurity: No Food Insecurity (03/06/2023)   Hunger Vital Sign    Worried About Running Out of Food in the Last Year: Never true    Ran Out of Food in the Last  Year: Never true  Transportation Needs: No Transportation Needs (03/06/2023)   PRAPARE - Administrator, Civil Service (Medical): No    Lack of Transportation (Non-Medical): No  Physical Activity: Inactive (11/24/2021)   Exercise Vital Sign    Days of Exercise per Week: 0 days    Minutes of Exercise per Session: 0 min  Stress: No Stress Concern Present (03/06/2023)   [employer] of Occupational Health - Occupational Stress Questionnaire    Feeling of Stress : Not at all  Social Connections: Moderately Integrated (03/06/2023)   Social Connection and Isolation Panel [NHANES]    Frequency of Communication with Friends and Family: Three times a week    Frequency of Social Gatherings with Friends and Family: Once a week    Attends Religious Services: More than 4 times per year    Active Member of [employer] or Organizations: No    Attends Engineer, structural: Never    Marital Status: Married    Tobacco Counseling Counseling given: Not Answered   Clinical Intake:  Pre-visit preparation completed: Yes  Pain : No/denies pain        How often do you need to have someone help you when you read instructions, pamphlets, or other written materials from your doctor or pharmacy?: 1 - Never  Diabetic?  no  Interpreter Needed?: No  Information entered by :: Remi Haggard LPN   Activities of Daily Living    03/06/2023    9:13 AM  In your present state of health, do you have any difficulty performing the following activities:  Hearing? 0  Vision? 0  Difficulty concentrating or making decisions? 0  Walking or climbing stairs? 0  Dressing or bathing? 0  Doing errands, shopping? 0  Using the Toilet? N  In the past six months, have you accidently leaked urine? N  Do you have problems with loss of bowel control? N  Managing your Medications? N  Managing your Finances? N  Housekeeping or managing your Housekeeping? N    Patient Care Team: Celine Mans, MD as PCP  - General (Family Medicine)  Indicate any recent Medical Services you may have received from other than [facility] providers in the past year (date may be approximate).     Assessment:   This is a routine wellness examination for Deborah [name].  Hearing/Vision screen Hearing Screening - Comments:: No trouble hearing Vision Screening - Comments:: Not up to date  Dietary issues and exercise activities discussed: Current Exercise Habits: The patient does not participate in regular exercise at present, Exercise limited by: orthopedic condition(s)  Goals Addressed             This Visit's Progress    Patient Stated   On track    Control chronic back pain.      Patient Stated       Staying on track with back pain Loose weight 5 lbs       Depression Screen    03/06/2023    9:14 AM 01/04/2023    4:31 PM 08/07/2022    2:54 PM 01/12/2022    3:55 PM 11/24/2021    4:03 PM 01/26/2021    4:09 PM 01/10/2021    3:53 PM  PHQ 2/9 Scores  PHQ - 2 Score 0 0 0 0 0 0 0  PHQ- 9 Score 0 0 0 0  0 0    Fall Risk    03/06/2023    9:08 AM 01/12/2022    3:54 PM 11/24/2021    4:05 PM 01/10/2021    3:52 PM 04/26/2020    3:38 PM  Fall Risk   Falls in the past year? 0 0 0 0 0  Number falls in past yr: 0 0 0 0 0  Injury with Fall? 0 0 0 0 0  Risk for fall due to :   No Fall Risks    Follow up Falls evaluation completed;Education provided;Falls prevention discussed  Falls prevention discussed      FALL RISK PREVENTION PERTAINING TO THE HOME:  Any stairs in or around the home? Yes  If so, are there any without handrails? No  Home free of loose throw rugs in walkways, pet beds, electrical cords, etc? No  Adequate lighting in your home to reduce risk of falls? No   ASSISTIVE DEVICES UTILIZED TO PREVENT FALLS:  Life alert? No  Use of a cane, walker or w/c? No  Grab bars in the bathroom? Yes  Shower chair or bench in shower? No  Elevated toilet seat or a handicapped toilet? No   TIMED UP AND GO:  Was  the test performed? No .    Cognitive Function:        03/06/2023    9:11 AM 11/24/2021    4:06 PM  6CIT Screen  What Year? 0 points 0 points  What month? 0 points 0 points  What time? 0 points 0 points  Count back from 20 0 points 0 points  Months in reverse 0 points 0 points  Repeat phrase 2 points 0 points  Total Score 2 points 0 points    Immunizations Immunization History  Administered Date(s) Administered   Influenza,inj,Quad PF,6+ Mos 07/27/2017, 08/27/2019, 08/02/2020   Influenza-Unspecified 08/03/2021, 07/23/2022   Moderna SARS-COV2 Booster Vaccination 08/13/2020   Moderna Sars-Covid-2 Vaccination 12/13/2019, 01/13/2020   PNEUMOCOCCAL CONJUGATE-20 12/11/2022   Tdap 05/06/2022   Zoster Recombinat (Shingrix) 05/06/2022, 07/23/2022    TDAP status: Up to date  Flu Vaccine status: Up to date  Pneumococcal vaccine status: Up to date  Covid-19 vaccine status: Information provided on how to obtain vaccines.   Qualifies for Shingles Vaccine? No   Zostavax completed Yes   Shingrix Completed?: Yes  Screening Tests Health Maintenance  Topic Date Due   Colonoscopy  12/24/2022   COVID-19 Vaccine (4 - 2023-24 season) 03/22/2023 (Originally 06/02/2022)   INFLUENZA VACCINE  05/03/2023   Medicare Annual Wellness (AWV)  03/05/2024   MAMMOGRAM  02/28/2025   DTaP/Tdap/Td (2 - Td or Tdap) 05/06/2032   Pneumonia Vaccine 6+ Years old  Completed   DEXA SCAN  Completed   Hepatitis C Screening  Completed   Zoster Vaccines- Shingrix  Completed   HPV VACCINES  Aged Out    Health Maintenance  Health Maintenance Due  Topic Date Due   Colonoscopy  12/24/2022    Colonoscopy scheduled   Mammogram status: Completed  . Repeat every year  Bone Density status: Completed 2023. Results reflect: Bone density results: OSTEOPOROSIS. Repeat every 2 years.  Lung Cancer Screening: (Low Dose CT Chest recommended if Age 25-80 years, 30 pack-year currently smoking OR have quit w/in  15years.) does not qualify.   Lung Cancer Screening Referral:   Additional Screening:  Hepatitis C Screening: does not qualify; Completed 2019  Vision Screening: Recommended annual ophthalmology exams for early detection of glaucoma and other disorders of the eye. Is the patient up to date with their annual eye exam?  No  Who is the provider or what is the name of the office in which the patient attends annual eye exams? none If pt is not established with a provider, would they like to be referred to a provider to establish care? No .   Dental Screening: Recommended annual dental exams for proper oral hygiene  Community Resource Referral / Chronic Care Management: CRR required this visit?  No   CCM required this visit?  No      Plan:     I have personally reviewed and noted the following in the patient's chart:   Medical and social history Use of alcohol, tobacco or illicit drugs  Current medications and supplements including opioid prescriptions. Patient is not currently taking opioid prescriptions. Functional ability and status Nutritional status Physical activity Advanced directives List of other physicians Hospitalizations, surgeries, and ER visits in previous 12 months Vitals Screenings to include cognitive, depression, and falls Referrals and appointments  In addition, I have reviewed and discussed with patient certain preventive protocols, quality metrics, and best practice recommendations. A written personalized care plan for preventive services as well as general preventive health recommendations were provided to patient.     Remi Haggard, LPN   10/07/1094   Nurse Notes:

## 2023-03-07 ENCOUNTER — Encounter: Payer: Self-pay | Admitting: Gastroenterology

## 2023-03-20 ENCOUNTER — Encounter: Payer: Self-pay | Admitting: Gastroenterology

## 2023-03-20 ENCOUNTER — Ambulatory Visit (AMBULATORY_SURGERY_CENTER): Payer: Medicare Other | Admitting: Gastroenterology

## 2023-03-20 VITALS — BP 124/71 | HR 70 | Temp 97.8°F | Resp 17 | Ht 60.0 in | Wt 136.0 lb

## 2023-03-20 DIAGNOSIS — D123 Benign neoplasm of transverse colon: Secondary | ICD-10-CM

## 2023-03-20 DIAGNOSIS — Z09 Encounter for follow-up examination after completed treatment for conditions other than malignant neoplasm: Secondary | ICD-10-CM | POA: Diagnosis not present

## 2023-03-20 DIAGNOSIS — Z86010 Personal history of colonic polyps: Secondary | ICD-10-CM

## 2023-03-20 MED ORDER — SODIUM CHLORIDE 0.9 % IV SOLN
500.0000 mL | INTRAVENOUS | Status: DC
Start: 2023-03-20 — End: 2023-03-20

## 2023-03-20 NOTE — Progress Notes (Unsigned)
Called to room to assist during endoscopic procedure.  Patient ID and intended procedure confirmed with present staff. Received instructions for my participation in the procedure from the performing physician.  

## 2023-03-20 NOTE — Patient Instructions (Addendum)
- Resume previous diet.                           - Continue present medications.                           - External and internal hemorrhoids.                               Handouts on findings given to patient ( hemorrhoids and polyps)                           - Await pathology results.                           - Repeat colonoscopy in 5 years for surveillance                            based on pathology results.  YOU HAD AN ENDOSCOPIC PROCEDURE TODAY AT THE [facility]:   Refer to the procedure report that was given to you for any specific questions about what was found during the examination.  If the procedure report does not answer your questions, please call your gastroenterologist to clarify.  If you requested that your care partner not be given the details of your procedure findings, then the procedure report has been included in a sealed envelope for you to review at your convenience later.  YOU SHOULD EXPECT: Some feelings of bloating in the abdomen. Passage of more gas than usual.  Walking can help get rid of the air that was put into your GI tract during the procedure and reduce the bloating. If you had a lower endoscopy (such as a colonoscopy or flexible sigmoidoscopy) you may notice spotting of blood in your stool or on the toilet paper. If you underwent a bowel prep for your procedure, you may not have a normal bowel movement for a few days.  Please Note:  You might notice some irritation and congestion in your nose or some drainage.  This is from the oxygen used during your procedure.  There is no need for concern and it should clear up in a day or so.  SYMPTOMS TO REPORT IMMEDIATELY:  Following lower endoscopy (colonoscopy or flexible sigmoidoscopy):  Excessive amounts of blood in the stool  Significant tenderness or worsening of abdominal pains  Swelling of the abdomen that is new, acute  Fever of 100F or higher   For urgent or  emergent issues, a gastroenterologist can be reached at any hour by calling [phone]. Do not use MyChart messaging for urgent concerns.    DIET:  We do recommend a small meal at first, but then you may proceed to your regular diet.  Drink plenty of fluids but you should avoid alcoholic beverages for 24 hours.  ACTIVITY:  You should plan to take it easy for the rest of today and you should NOT DRIVE or use heavy machinery until tomorrow (because of the sedation medicines used during the test).    FOLLOW UP: Our staff will call the number listed on your records the next business day following your procedure.  We will call around 7:15- 8:00 am to check on you and address any questions  or concerns that you may have regarding the information given to you following your procedure. If we do not reach you, we will leave a message.     If any biopsies were taken you will be contacted by phone or by letter within the next 1-3 weeks.  Please call us at [phone] if you have not heard about the biopsies in 3 weeks.    SIGNATURES/CONFIDENTIALITY: You and/or your care partner have signed paperwork which will be entered into your electronic medical record.  These signatures attest to the fact that that the information above on your After Visit Summary has been reviewed and is understood.  Full responsibility of the confidentiality of this discharge information lies with you and/or your care-partner.

## 2023-03-20 NOTE — Progress Notes (Unsigned)
Report to PACU, RN, vss, BBS= Clear.  

## 2023-03-20 NOTE — Op Note (Signed)
[facility] Patient Name: Deborah [name] Procedure Date: 03/20/2023 8:14 AM MRN: [id number] Endoscopist: Napoleon Form , MD, [id number] Age: 67 Referring MD:  Date of Birth: 02-14-1956 Gender: Female Account #: [id number] Procedure:                Colonoscopy Indications:              High risk colon cancer surveillance: Personal                            history of colonic polyps, High risk colon cancer                            surveillance: Personal history of adenoma (10 mm or                            greater in size), High risk colon cancer                            surveillance: Personal history of multiple (3 or                            more) adenomas Medicines:                Monitored Anesthesia Care Procedure:                Pre-Anesthesia Assessment:                           - Prior to the procedure, a History and Physical                            was performed, and patient medications and                            allergies were reviewed. The patient's tolerance of                            previous anesthesia was also reviewed. The risks                            and benefits of the procedure and the sedation                            options and risks were discussed with the patient.                            All questions were answered, and informed consent                            was obtained. Prior Anticoagulants: The patient has                            taken no anticoagulant or antiplatelet agents. ASA  Grade Assessment: II - A patient with mild systemic                            disease. After reviewing the risks and benefits,                            the patient was deemed in satisfactory condition to                            undergo the procedure.                           After obtaining informed consent, the colonoscope                            was passed under direct vision. Throughout the                             procedure, the patient's blood pressure, pulse, and                            oxygen saturations were monitored continuously. The                            [id number]-[id number] Colonoscope [id number] was introduced                            through the anus and advanced to the the cecum,                            identified by appendiceal orifice and ileocecal                            valve. The colonoscopy was performed without                            difficulty. The patient tolerated the procedure                            well. The quality of the bowel preparation was                            good. The ileocecal valve, appendiceal orifice, and                            rectum were photographed. Scope In: 8:23:23 AM Scope Out: 8:36:50 AM Scope Withdrawal Time: 0 hours 8 minutes 16 seconds  Total Procedure Duration: 0 hours 13 minutes 27 seconds  Findings:                 The perianal and digital rectal examinations were                            normal.  A 6 mm polyp was found in the transverse colon. The                            polyp was sessile. The polyp was removed with a                            cold snare. Resection and retrieval were complete.                           Non-bleeding external and internal hemorrhoids were                            found during retroflexion. The hemorrhoids were                            medium-sized. Complications:            No immediate complications. Estimated Blood Loss:     Estimated blood loss was minimal. Impression:               - One 6 mm polyp in the transverse colon, removed                            with a cold snare. Resected and retrieved.                           - Non-bleeding external and internal hemorrhoids. Recommendation:           - Resume previous diet.                           - Continue present medications.                           - Await pathology  results.                           - Repeat colonoscopy in 5 years for surveillance                            based on pathology results. Napoleon Form, MD 03/20/2023 8:42:57 AM This report has been signed electronically.

## 2023-03-20 NOTE — Progress Notes (Unsigned)
Deborah [name] History and Physical   Primary Care Physician:  Celine Mans, MD   Reason for Procedure:  History of adenomatous colon polyps  Plan:    Surveillance colonoscopy with possible interventions as needed     HPI: Deborah [name] is a very pleasant 67 y.o. female here for surveillance colonoscopy. Denies any nausea, vomiting, abdominal pain, melena or bright red blood per rectum  The risks and benefits as well as alternatives of endoscopic procedure(s) have been discussed and reviewed. All questions answered. The patient agrees to proceed.    Past Medical History:  Diagnosis Date   Arthritis    Hypertension     Past Surgical History:  Procedure Laterality Date   carpel tunnel Right 2005   CESAREAN SECTION  05/14/1993   COLONOSCOPY     ~10 yrs ago- normal per pt- in [place] per pt     Prior to Admission medications   Medication Sig Start Date End Date Taking? Authorizing Provider  Ascorbic Acid (VITAMIN C) 1000 MG tablet Take 1,000 mg by mouth daily.   Yes [provider]  cholecalciferol (VITAMIN D3) 25 MCG (1000 UNIT) tablet Take 1,000 Units by mouth daily.   Yes [provider]  Cyanocobalamin (VITAMIN B 12 PO) Take by mouth.   Yes [provider]  Magnesium 400 MG CAPS Take by mouth.   Yes [provider]  Multiple Vitamins-Minerals (ZINC PO) Take by mouth.   Yes [provider]  Omega-3 Fatty Acids (FISH OIL) 300 MG CAPS Take by mouth.   Yes [provider]  valsartan-hydrochlorothiazide (DIOVAN-HCT) 320-25 MG tablet Take 1 tablet by mouth daily. MAKE APPT PRIOR TO NEXT REFILL 01/04/23  Yes Celine Mans, MD  vitamin E 180 MG (400 UNITS) capsule Take 400 Units by mouth daily.   Yes [provider]  Zinc Sulfate (ZINC 15 PO) Take by mouth.   Yes [provider]  Ibuprofen (ADVIL PO) Take by mouth. Patient not taking: Reported on 01/04/2023    [provider]     Current Outpatient Medications  Medication Sig Dispense Refill   Ascorbic Acid (VITAMIN C) 1000 MG tablet Take 1,000 mg by mouth daily.     cholecalciferol (VITAMIN D3) 25 MCG (1000 UNIT) tablet Take 1,000 Units by mouth daily.     Cyanocobalamin (VITAMIN B 12 PO) Take by mouth.     Magnesium 400 MG CAPS Take by mouth.     Multiple Vitamins-Minerals (ZINC PO) Take by mouth.     Omega-3 Fatty Acids (FISH OIL) 300 MG CAPS Take by mouth.     valsartan-hydrochlorothiazide (DIOVAN-HCT) 320-25 MG tablet Take 1 tablet by mouth daily. MAKE APPT PRIOR TO NEXT REFILL 90 tablet 0   vitamin E 180 MG (400 UNITS) capsule Take 400 Units by mouth daily.     Zinc Sulfate (ZINC 15 PO) Take by mouth.     Ibuprofen (ADVIL PO) Take by mouth. (Patient not taking: Reported on 01/04/2023)     Current Facility-Administered Medications  Medication Dose Route Frequency Provider Last Rate Last Admin   0.9 %  sodium chloride infusion  500 mL Intravenous Continuous Napoleon Form, MD        Allergies as of 03/20/2023 - Review Complete 03/20/2023  Allergen Reaction Noted   Pork-derived products  08/27/2019    Family History  Problem Relation Age of Onset   Ovarian cancer Mother    Stroke Father    Breast cancer Sister    Colon polyps  Sister    Stroke Brother        in his early thirties   Colon cancer Neg Hx    Esophageal cancer Neg Hx    Rectal cancer Neg Hx    Stomach cancer Neg Hx     Social History   Socioeconomic History   Marital status: Married    Spouse name: Deborah [name]   Number of children: 2   Years of education: Not on file   Highest education level: Not on file  Occupational History   Occupation: [employer]   Tobacco Use   Smoking status: Never    Passive exposure: Past   Smokeless tobacco: Never  Vaping Use   Vaping Use: Never used  Substance and Sexual Activity   Alcohol use: Never   Drug use: Never   Sexual activity: Yes    Partners: Male    Birth control/protection:  Post-menopausal    Comment: Husband   Other Topics Concern   Not on file  Social History Narrative   Patient lives with her husband Marion.    Patient has two children.    Patient helps with her granddaughter.    Patient has chronic back pain which limits her exercise.       Social Determinants of Health   Financial Resource Strain: Low Risk  (03/06/2023)   Overall Financial Resource Strain (CARDIA)    Difficulty of Paying Living Expenses: Not hard at all  Food Insecurity: No Food Insecurity (03/06/2023)   Hunger Vital Sign    Worried About Running Out of Food in the Last Year: Never true    Ran Out of Food in the Last Year: Never true  Transportation Needs: No Transportation Needs (03/06/2023)   PRAPARE - Administrator, Civil Service (Medical): No    Lack of Transportation (Non-Medical): No  Physical Activity: Inactive (11/24/2021)   Exercise Vital Sign    Days of Exercise per Week: 0 days    Minutes of Exercise per Session: 0 min  Stress: No Stress Concern Present (03/06/2023)   [employer] of Occupational Health - Occupational Stress Questionnaire    Feeling of Stress : Not at all  Social Connections: Moderately Integrated (03/06/2023)   Social Connection and Isolation Panel [NHANES]    Frequency of Communication with Friends and Family: Three times a week    Frequency of Social Gatherings with Friends and Family: Once a week    Attends Religious Services: More than 4 times per year    Active Member of [employer] or Organizations: No    Attends Banker Meetings: Never    Marital Status: Married  Catering manager Violence: Not At Risk (03/06/2023)   Humiliation, Afraid, Rape, and Kick questionnaire    Fear of Current or Ex-Partner: No    Emotionally Abused: No    Physically Abused: No    Sexually Abused: No    Review of Systems:  All other review of systems negative except as mentioned in the HPI.  Physical Exam: Vital signs in last 24 hours: Blood  Pressure 135/84   Pulse 85   Temperature 97.8 F (36.6 C)   Height 5' (1.524 m)   Weight 136 lb (61.7 kg)   Oxygen Saturation 98%   Body Mass Index 26.56 kg/m  General:   Alert, NAD Lungs:  Clear .   Heart:  Regular rate and rhythm Abdomen:  Soft, nontender and nondistended. Neuro/Psych:  Alert and cooperative. Normal mood and affect. A and O  x 3  Reviewed labs, radiology imaging, old records and pertinent past GI work up  Patient is appropriate for planned procedure(s) and anesthesia in an ambulatory setting   K. Denzil Magnuson , MD [phone]

## 2023-03-20 NOTE — Progress Notes (Signed)
Pt's states no medical or surgical changes since previsit or office visit. 

## 2023-03-21 ENCOUNTER — Encounter: Payer: Self-pay | Admitting: Gastroenterology

## 2023-03-21 ENCOUNTER — Telehealth: Payer: Self-pay | Admitting: *Deleted

## 2023-03-21 NOTE — Telephone Encounter (Signed)
Attempted to call patient for their post-procedure follow-up call. No answer. Left voicemail.   

## 2023-03-22 ENCOUNTER — Other Ambulatory Visit: Payer: Self-pay | Admitting: *Deleted

## 2023-03-23 ENCOUNTER — Encounter: Payer: Self-pay | Admitting: Gastroenterology

## 2023-03-26 ENCOUNTER — Other Ambulatory Visit: Payer: Self-pay | Admitting: Family Medicine

## 2023-03-26 DIAGNOSIS — I10 Essential (primary) hypertension: Secondary | ICD-10-CM

## 2023-04-05 NOTE — Progress Notes (Deleted)
    SUBJECTIVE:   CHIEF COMPLAINT / HPI: BP follow-up  ***  PERTINENT  PMH / PSH: HTN   OBJECTIVE:   There were no vitals taken for this visit.  ***  ASSESSMENT/PLAN:   There are no diagnoses linked to this encounter. No follow-ups on file.  Celine Mans, MD [facility]

## 2023-04-06 ENCOUNTER — Ambulatory Visit: Payer: Medicare Other | Admitting: Family Medicine

## 2023-04-12 ENCOUNTER — Encounter: Payer: Self-pay | Admitting: Family Medicine

## 2023-04-12 ENCOUNTER — Ambulatory Visit (INDEPENDENT_AMBULATORY_CARE_PROVIDER_SITE_OTHER): Payer: Medicare Other | Admitting: Family Medicine

## 2023-04-12 VITALS — BP 132/78 | HR 82 | Wt 141.0 lb

## 2023-04-12 DIAGNOSIS — I10 Essential (primary) hypertension: Secondary | ICD-10-CM

## 2023-04-12 DIAGNOSIS — R7303 Prediabetes: Secondary | ICD-10-CM | POA: Diagnosis not present

## 2023-04-12 LAB — POCT GLYCOSYLATED HEMOGLOBIN (HGB A1C): Hemoglobin A1C: 5.6 % (ref 4.0–5.6)

## 2023-04-12 NOTE — Assessment & Plan Note (Addendum)
Stable, Continue Diovan, BP log from home looks WNL

## 2023-04-12 NOTE — Assessment & Plan Note (Addendum)
Last A1C 2023 was 5.7, Will get new A1C today

## 2023-04-12 NOTE — Progress Notes (Addendum)
    SUBJECTIVE:   CHIEF COMPLAINT / HPI:   Hypertension: - Pt has kept a thorough log of her blood pressures at home, they are in normal range  Health maintenance: - Pt had mammogram in May (normal), and colonoscopy - showed 1 tubular adenoma  Prediabetes: - Pt said it's been a while since her last A1C, will recheck today.   PERTINENT  PMH / PSH: HTN  OBJECTIVE:   BP 132/78   Pulse 82   Wt 141 lb (64 kg)   SpO2 98%   BMI 27.54 kg/m   Resting comfortably, no acute distress  ASSESSMENT/PLAN:   Essential hypertension Stable, Continue Diovan, BP log from home looks WNL  Prediabetes Last A1C 2023 was 5.7, Will get new A1C today     Para March, DO [facility]

## 2023-04-12 NOTE — Patient Instructions (Signed)
Good to see you today - Thank you for coming in  Things we discussed today: Prediabetes - last A1C was in 2021, will recheck today. Will notify you of results.  Cholesterol - labs last year were normal, will hold off on rechecking this today Blood pressure - continue monitoring at home, continue current medication   Please always bring your medication bottles  Come back to see me in 6 months

## 2023-06-25 ENCOUNTER — Other Ambulatory Visit: Payer: Self-pay | Admitting: Family Medicine

## 2023-06-25 DIAGNOSIS — I10 Essential (primary) hypertension: Secondary | ICD-10-CM

## 2023-07-03 ENCOUNTER — Ambulatory Visit (INDEPENDENT_AMBULATORY_CARE_PROVIDER_SITE_OTHER): Payer: Medicare Other | Admitting: Family Medicine

## 2023-07-03 VITALS — BP 133/87 | HR 67 | Ht 60.0 in | Wt 143.4 lb

## 2023-07-03 DIAGNOSIS — R350 Frequency of micturition: Secondary | ICD-10-CM | POA: Insufficient documentation

## 2023-07-03 DIAGNOSIS — Z23 Encounter for immunization: Secondary | ICD-10-CM

## 2023-07-03 LAB — POCT URINALYSIS DIP (MANUAL ENTRY)
Bilirubin, UA: NEGATIVE
Glucose, UA: NEGATIVE mg/dL
Ketones, POC UA: NEGATIVE mg/dL
Leukocytes, UA: NEGATIVE
Nitrite, UA: NEGATIVE
Protein Ur, POC: NEGATIVE mg/dL
Spec Grav, UA: <=1.005 — AB (ref 1.010–1.025)
Urobilinogen, UA: 0.2 U/dL
pH, UA: 6 (ref 5.0–8.0)

## 2023-07-03 LAB — POCT UA - MICROSCOPIC ONLY
Bacteria, U Microscopic: NONE SEEN
WBC, Ur, HPF, POC: NONE SEEN (ref 0–5)

## 2023-07-03 NOTE — Patient Instructions (Addendum)
Please try some pelvic floor exercises and try cutting out things like coffee and tea  I will let you know if any results from today are abnormal. If everything looks good, I will send a message on mychart  If symptoms do not improve after about 1-2 weeks, let me know and we can try a medication called mirabegron (myrbetriq)

## 2023-07-03 NOTE — Progress Notes (Signed)
    SUBJECTIVE:   CHIEF COMPLAINT / HPI:   Urinary frequency x49mo Last A1c 5.6 in July 2024 History of E. coli UTI in 2023, pansensitive except for ampicillin.  Treated with Keflex These symptoms do not feel similar to her UTI  Wakes up around 4-5 times per night to use the restroom Has to use the restroom almost every hour during the day States she is able to empty her bladder every time Denies any incontinence  No issues or changes with stools Denies polydipsia  Has not been taking any new meds - has been on Diovan for several years at the same dose No recent changes to diet - drinks tea and coffee daily but nothing new. Eats lots of salads and oatmeal, fruits. Has chronic back pain - History of lumbar spinal stenosis without spinal cord compression noted 2018 Denies any numbness / saddle anesthesia Denies stress/urge incontinence  PERTINENT  PMH / PSH: lumbar spinal stenosis, UTI in 2023  OBJECTIVE:   BP 133/87   Pulse 67   Ht 5' (1.524 m)   Wt 143 lb 6.4 oz (65 kg)   SpO2 99%   BMI 28.01 kg/m    General: NAD, pleasant, able to participate in exam Cardiac: RRR, no murmurs auscultated Respiratory: CTAB, normal WOB Abdomen: soft, non-tender, non-distended, normoactive bowel sounds Extremities: warm and well perfused, no edema or cyanosis Skin: warm and dry, no rashes noted Neuro: alert, no obvious focal deficits, speech normal Psych: Normal affect and mood   ASSESSMENT/PLAN:   Assessment & Plan Urinary frequency Suspect overactive bladder, vs UTI, less likely neurogenic given no incontinence.  Benign exam and UA unremarkable today.  Checking BMP to evaluate for hyperglycemia and/or electrolyte/kidney abnormalities.  Encouraged lifestyle changes first including trying to cut out coffee and tea, as well as Kegel exercises to strengthen pelvic floor.  Patient already keeps a diary of her intake and output.  Instructed patient to reach out if these interventions do not  help within the next 1 to 2 weeks at which time would likely consider starting low-dose Myrbetriq (BP well-controlled on Diovan, but would likely need close monitoring of her blood pressure if we started this)   Vonna Drafts, MD [facility]

## 2023-07-03 NOTE — Assessment & Plan Note (Addendum)
Suspect overactive bladder, vs UTI, less likely neurogenic given no incontinence.  Benign exam and UA unremarkable today.  Checking BMP to evaluate for hyperglycemia and/or electrolyte/kidney abnormalities.  Encouraged lifestyle changes first including trying to cut out coffee and tea, as well as Kegel exercises to strengthen pelvic floor.  Patient already keeps a diary of her intake and output.  Instructed patient to reach out if these interventions do not help within the next 1 to 2 weeks at which time would likely consider starting low-dose Myrbetriq (BP well-controlled on Diovan, but would likely need close monitoring of her blood pressure if we started this)

## 2023-07-04 ENCOUNTER — Encounter: Payer: Self-pay | Admitting: Family Medicine

## 2023-07-04 LAB — BASIC METABOLIC PANEL
BUN/Creatinine Ratio: 19 (ref 12–28)
BUN: 13 mg/dL (ref 8–27)
CO2: 27 mmol/L (ref 20–29)
Calcium: 9.5 mg/dL (ref 8.7–10.3)
Chloride: 101 mmol/L (ref 96–106)
Creatinine, Ser: 0.67 mg/dL (ref 0.57–1.00)
Glucose: 83 mg/dL (ref 70–99)
Potassium: 4 mmol/L (ref 3.5–5.2)
Sodium: 141 mmol/L (ref 134–144)
eGFR: 95 mL/min/{1.73_m2} (ref >59)

## 2023-07-05 LAB — URINE CULTURE

## 2023-09-24 ENCOUNTER — Other Ambulatory Visit: Payer: Self-pay | Admitting: Family Medicine

## 2023-09-24 DIAGNOSIS — I10 Essential (primary) hypertension: Secondary | ICD-10-CM

## 2023-12-12 ENCOUNTER — Encounter: Payer: Self-pay | Admitting: Student

## 2023-12-12 ENCOUNTER — Ambulatory Visit (INDEPENDENT_AMBULATORY_CARE_PROVIDER_SITE_OTHER)

## 2023-12-12 ENCOUNTER — Ambulatory Visit
Admission: RE | Admit: 2023-12-12 | Discharge: 2023-12-12 | Disposition: A | Discharge status: Home / Self Care | Source: Ambulatory Visit | Attending: Family Medicine | Admitting: Family Medicine

## 2023-12-12 VITALS — BP 139/87 | HR 76 | Ht 60.0 in | Wt 143.4 lb

## 2023-12-12 DIAGNOSIS — J449 Chronic obstructive pulmonary disease, unspecified: Secondary | ICD-10-CM | POA: Diagnosis not present

## 2023-12-12 DIAGNOSIS — R058 Other specified cough: Secondary | ICD-10-CM

## 2023-12-12 MED ORDER — FLUTICASONE PROPIONATE 50 MCG/ACT NA SUSP
2.0000 | Freq: Every day | NASAL | 6 refills | Status: AC
Start: 2023-12-12 — End: ?

## 2023-12-12 NOTE — Patient Instructions (Addendum)
 It was great to see you! Thank you for allowing me to participate in your care!   Our plans for today:  - Coughs can last after virus from 6-8 weeks - I am prescribing flonase to see if this may help is there is a component of post nasal drip - With duration and symptoms I am going to get a chest xray for evaluation - If worsening, fevers or not improving in 2-3 weeks please return to care  Take care and seek immediate care sooner if you develop any concerns.  Levin Erp, MD

## 2023-12-12 NOTE — Progress Notes (Signed)
    SUBJECTIVE:   CHIEF COMPLAINT / HPI: Cough   Presents with a persistent cough of approximately four weeks duration The cough is productive, with  production of phlegm, but denies hemoptysis.  The cough is not associated with fevers, with the last reported fever occurring in February 11th. Has been managing the cough with home remedies such as ginger drink, lemon, honey, and steam inhalation/humidifier Denies any history of smoking.  Reports no difficulty with eating or drinking.  Denies any sensation of post-nasal drip.  PERTINENT  PMH / PSH: tubular adenoma colon  OBJECTIVE:   BP 139/87   Pulse 76   Ht 5' (1.524 m)   Wt 143 lb 6.4 oz (65 kg)   SpO2 98%   BMI 28.01 kg/m   General: Well appearing, NAD, awake, alert, responsive to questions Head: Normocephalic atraumatic, nasal congestion present, no oropharyngeal exudates CV: Regular rate and rhythm no murmurs rubs or gallops Respiratory: Clear to ausculation bilaterally, no wheezes rales or crackles, chest rises symmetrically,  no increased work of breathing Extremities: Moves upper and lower extremities freely, no edema in LE, no calf tenderness  ASSESSMENT/PLAN:   Assessment & Plan Productive cough Suspected post-viral cough syndrome following viral illness. No systemic symptoms or history of asthma, COPD, smoking. - Order chest x-ray to rule out pulmonary pathology - Flonase nasal spray - Advise follow-up if no improvement in 2-3 weeks - ED/return precautions discussed   Levin Erp, MD [facility]

## 2023-12-14 ENCOUNTER — Telehealth: Payer: Self-pay

## 2023-12-14 NOTE — Telephone Encounter (Signed)
 Spoke with patient to make appt with Dr. Raymondo Band to do PFT's. Patient stated that she feels her breathing is getting better, so she wants to wait a couple of weeks. If she feels she needs to have this done. She will call back and make an appt. Aquilla Solian, CMA

## 2023-12-22 ENCOUNTER — Other Ambulatory Visit: Payer: Self-pay | Admitting: Family Medicine

## 2023-12-22 DIAGNOSIS — I10 Essential (primary) hypertension: Secondary | ICD-10-CM

## 2023-12-23 IMAGING — MG MM DIGITAL SCREENING BILAT W/ TOMO AND CAD
8 series · 8 of 24 positions shown · non-contrast
Comparison: Previous exam(s).

CLINICAL DATA: Screening.

EXAM:
DIGITAL SCREENING BILATERAL MAMMOGRAM WITH TOMOSYNTHESIS AND CAD
TECHNIQUE: Bilateral screening digital craniocaudal and mediolateral oblique
mammograms were obtained. Bilateral screening digital breast
tomosynthesis was performed. The images were evaluated with
computer-aided detection.

[L MLO synth-2D]
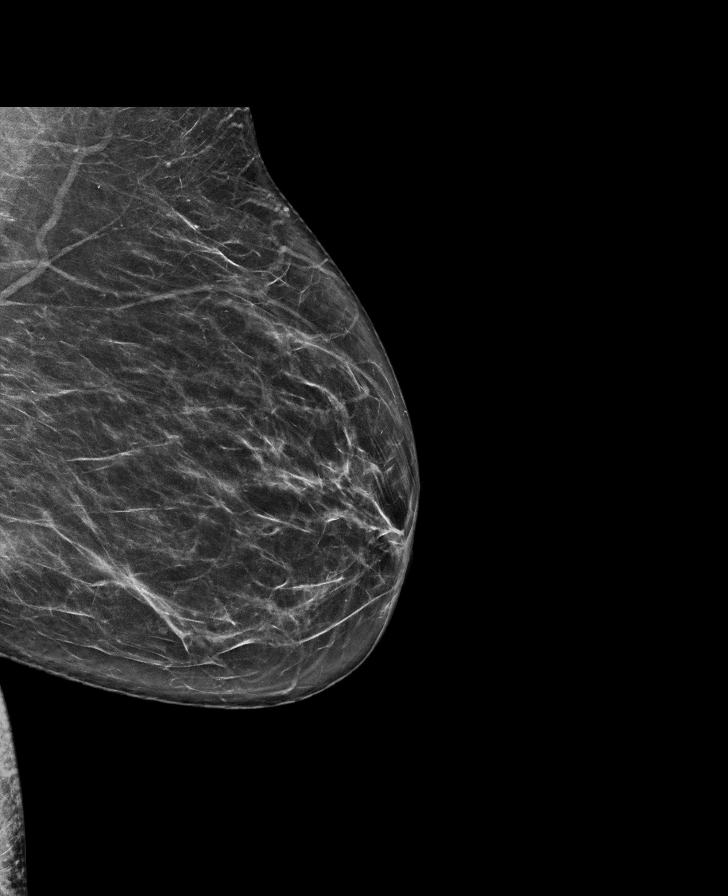

[R MLO synth-2D]
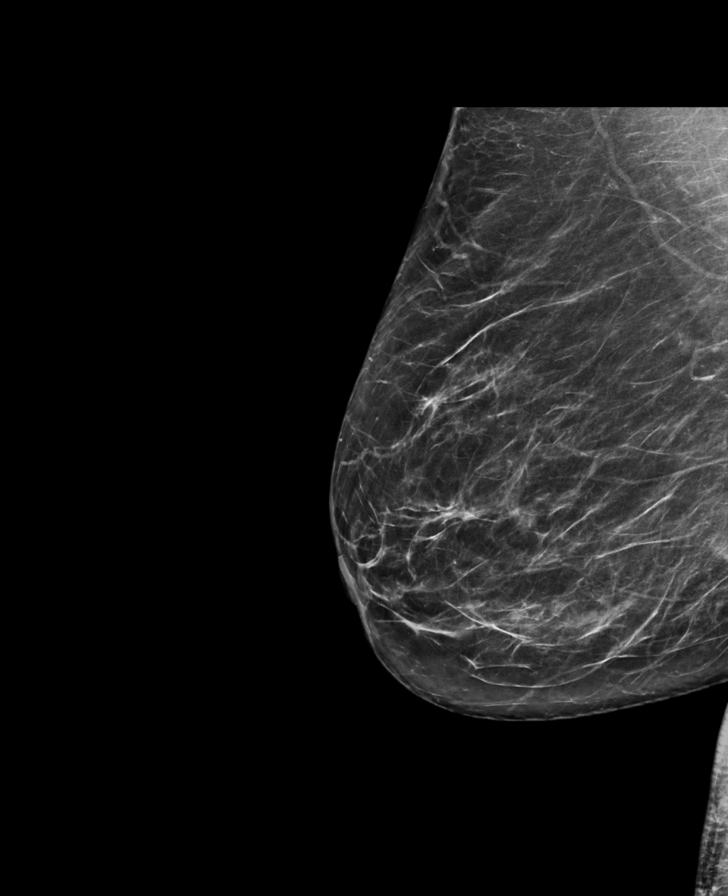

[R CC synth-2D]
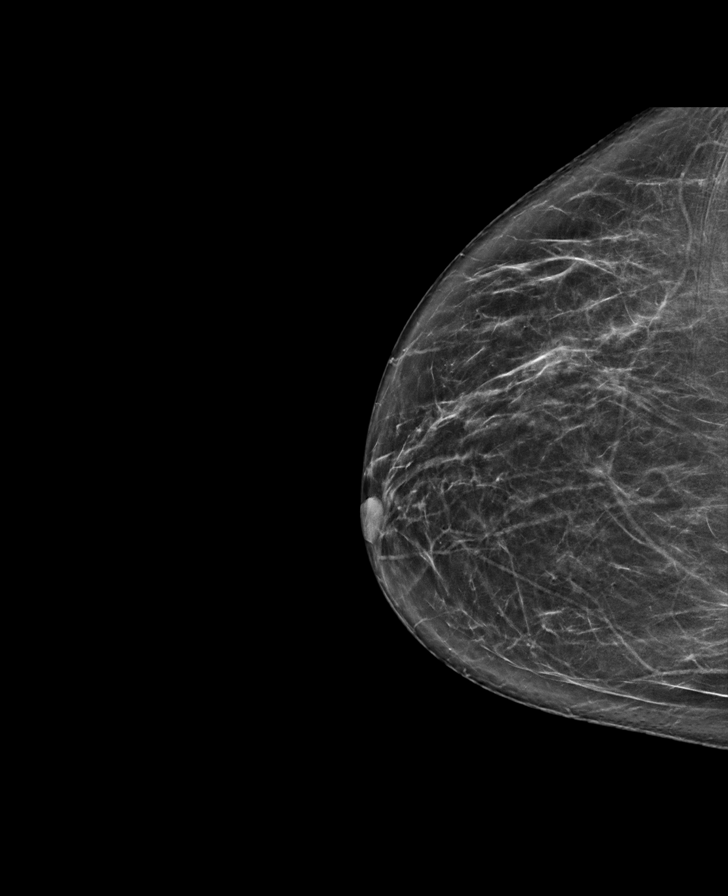

[L CC synth-2D]
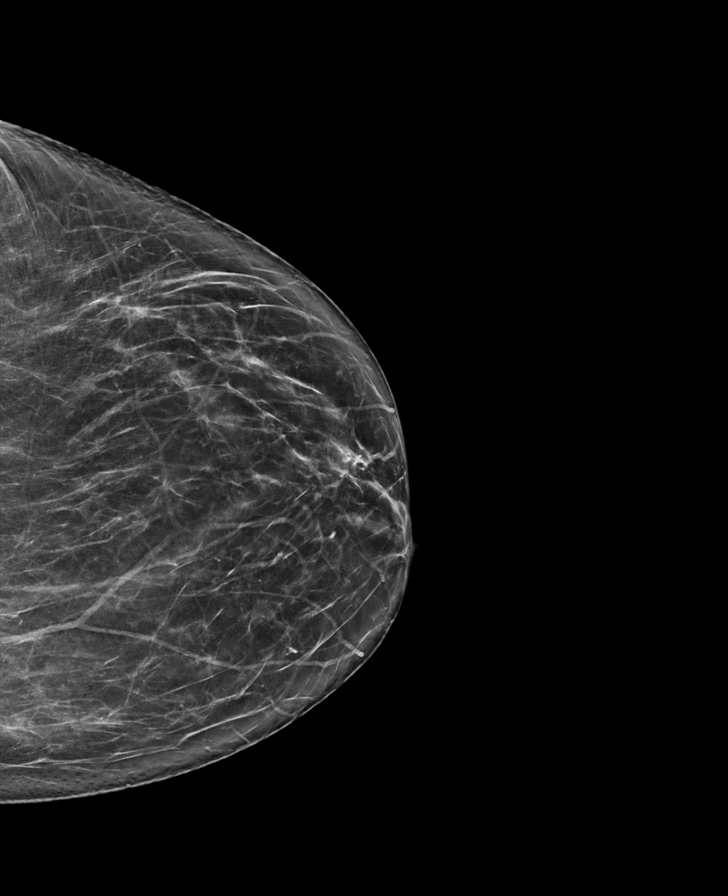

[R MLO tomo · tomo slice 39/78.0]
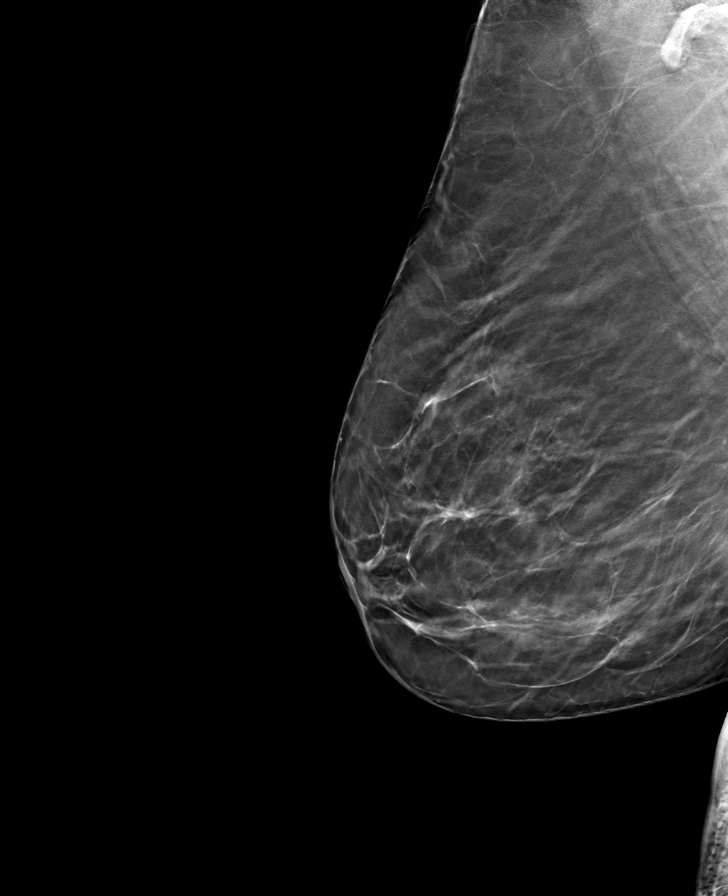

[R CC tomo · tomo slice 35/68.0]
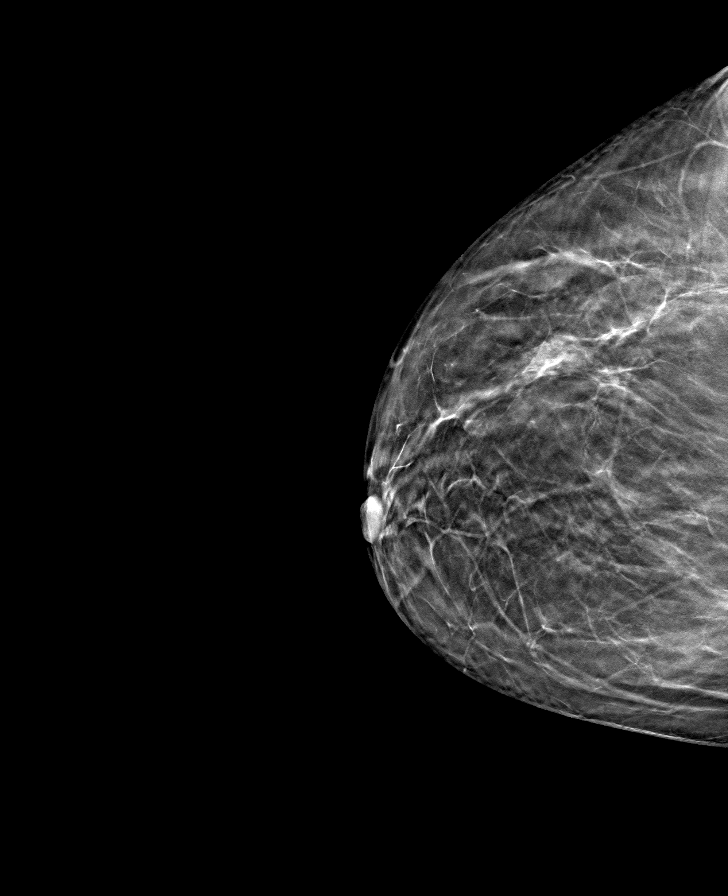

[L CC tomo · tomo slice 35/69.0]
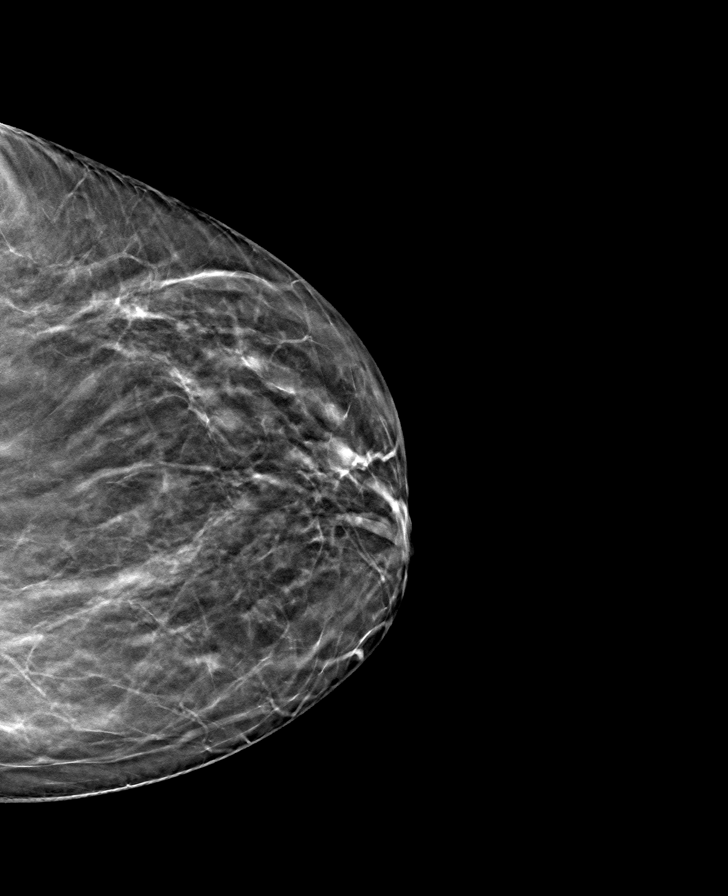

[L MLO tomo · tomo slice 39/76.0]
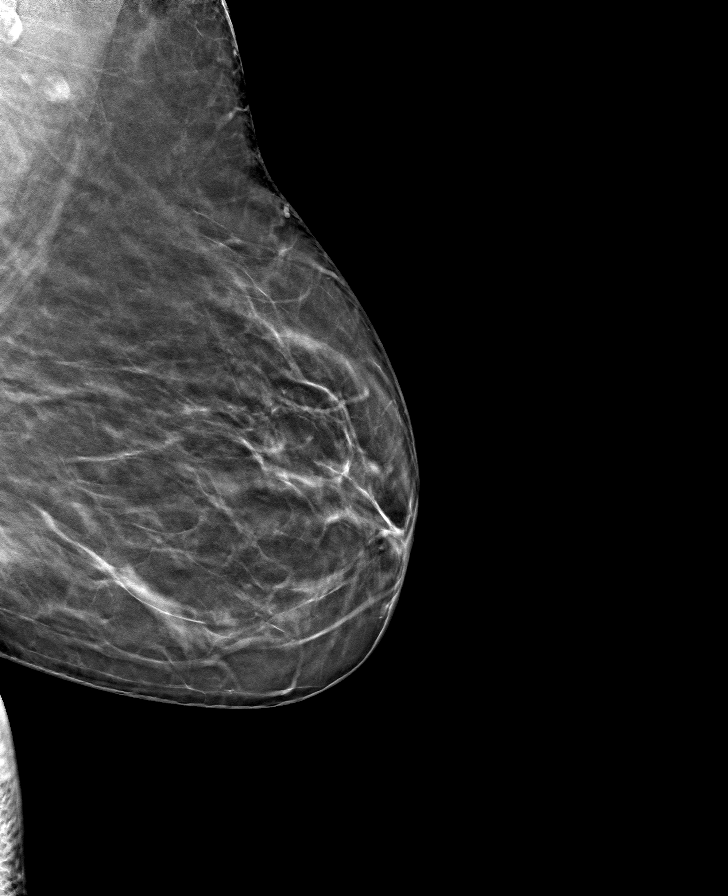

[8 of 24 positions shown; findings below may reference images not displayed]

ACR Breast Density Category b: There are scattered areas of
fibroglandular density.
FINDINGS: There are no findings suspicious for malignancy.
IMPRESSION: No mammographic evidence of malignancy. A result letter of this
screening mammogram will be mailed directly to the patient.

RECOMMENDATION:
Screening mammogram in one year. (Code:51-O-LD2)

BI-RADS CATEGORY  1: Negative.

## 2024-02-11 ENCOUNTER — Other Ambulatory Visit: Payer: Self-pay | Admitting: Family Medicine

## 2024-02-11 DIAGNOSIS — Z1231 Encounter for screening mammogram for malignant neoplasm of breast: Secondary | ICD-10-CM

## 2024-03-13 ENCOUNTER — Ambulatory Visit

## 2024-03-14 ENCOUNTER — Ambulatory Visit
Admission: RE | Admit: 2024-03-14 | Discharge: 2024-03-14 | Disposition: A | Discharge status: Home / Self Care | Source: Ambulatory Visit | Attending: Family Medicine | Admitting: Family Medicine

## 2024-03-14 DIAGNOSIS — Z1231 Encounter for screening mammogram for malignant neoplasm of breast: Secondary | ICD-10-CM

## 2024-03-22 ENCOUNTER — Other Ambulatory Visit: Payer: Self-pay | Admitting: Family Medicine

## 2024-03-22 DIAGNOSIS — I10 Essential (primary) hypertension: Secondary | ICD-10-CM

## 2024-04-10 ENCOUNTER — Ambulatory Visit: Admitting: Student

## 2024-04-10 ENCOUNTER — Ambulatory Visit: Payer: Medicare Other

## 2024-04-10 VITALS — BP 138/86 | HR 83 | Wt 140.8 lb

## 2024-04-10 VITALS — Ht 60.0 in | Wt 138.0 lb

## 2024-04-10 DIAGNOSIS — E782 Mixed hyperlipidemia: Secondary | ICD-10-CM

## 2024-04-10 DIAGNOSIS — Z0000 Encounter for general adult medical examination without abnormal findings: Secondary | ICD-10-CM | POA: Diagnosis not present

## 2024-04-10 DIAGNOSIS — R350 Frequency of micturition: Secondary | ICD-10-CM | POA: Diagnosis not present

## 2024-04-10 LAB — POCT UA - MICROSCOPIC ONLY: RBC, Urine, Miroscopic: NONE SEEN (ref 0–2)

## 2024-04-10 LAB — POCT URINALYSIS DIP (MANUAL ENTRY)
Bilirubin, UA: NEGATIVE
Blood, UA: NEGATIVE
Glucose, UA: NEGATIVE mg/dL
Nitrite, UA: NEGATIVE
Protein Ur, POC: NEGATIVE mg/dL
Spec Grav, UA: 1.015 (ref 1.010–1.025)
Urobilinogen, UA: 0.2 U/dL
pH, UA: 6 (ref 5.0–8.0)

## 2024-04-10 MED ORDER — MIRABEGRON ER 25 MG PO TB24: 25.0000 mg | ORAL_TABLET | Freq: Every day | ORAL | 1 refills | Status: AC

## 2024-04-10 NOTE — Patient Instructions (Addendum)
 It was great to see you! Thank you for allowing me to participate in your care!  I recommend that you always bring your medications to each appointment as this makes it easy to ensure we are on the correct medications and helps us  not miss when refills are needed.  Our plans for today:  - Urinary Frequency I believe you have an overactive bladder that's causing you to have to urinate so frequently. We are going to try a medication that should help. Will also check some labs for other causes.  Start Mirabegron  25 mg daily  Follow up 1 month  Urine analysis, Checking electrolytes and kidney function  - High Cholesterol  Today we are checking your cholesterol. I will notify you if we need to start a medication.   We are checking some labs today, I will call you if they are abnormal will send you a MyChart message or a letter if they are normal.  If you do not hear about your labs in the next 2 weeks please let us  know.  Take care and seek immediate care sooner if you develop any concerns.   Dr. Penne Rhein, MD [facility]

## 2024-04-10 NOTE — Assessment & Plan Note (Addendum)
 Patient comes in for follow-up of her urinary frequency.  Patient reports having tried decreasing fluid intake, adjusting fluids drink, Kegel exercises, still continues to have urinary frequency.  Patient appreciates sometimes she has full volumes of urine and other times she has dribbles.  Patient denies any dysuria or systemic symptoms.  Patient had normal abdominal exam, no CVA tenderness.  Suspect patient has overactive bladder, given that she was evaluated for UTI, electrolyte issues previously.  Will obtain UA to rule out UTI, but likely will start mirabegron  and have follow up. - UA, BMP, urine culture - Mirabegron  25 mg daily - Follow-up 1 month

## 2024-04-10 NOTE — Patient Instructions (Signed)
 Ms. Dore , Thank you for taking time out of your busy schedule to complete your Annual Wellness Visit with me. I enjoyed our conversation and look forward to speaking with you again next year. I, as well as your care team,  appreciate your ongoing commitment to your health goals. Please review the following plan we discussed and let me know if I can assist you in the future. Your Game plan/ To Do List    Referrals: If you haven't heard from the office you've been referred to, please reach out to them at the phone provided.   Follow up Visits: Next Medicare AWV with our clinical staff: 04/13/2025 at 11:10 a.m. phone visit with Nurse Health Advisor   Have you seen your provider in the last 6 months (3 months if uncontrolled diabetes)? No Next Office Visit with your provider: Office will call patient to schedule an office visit with provider  Clinician Recommendations:  Aim for 30 minutes of exercise or brisk walking, 6-8 glasses of water, and 5 servings of fruits and vegetables each day.       This is a list of the screening recommended for you and due dates:  Health Maintenance  Topic Date Due   COVID-19 Vaccine (5 - 2024-25 season) 01/01/2024   Flu Shot  05/02/2024   Medicare Annual Wellness Visit  04/10/2025   Mammogram  03/14/2026   Colon Cancer Screening  03/19/2028   DTaP/Tdap/Td vaccine (2 - Td or Tdap) 05/06/2032   Pneumococcal Vaccine for age over 17  Completed   DEXA scan (bone density measurement)  Completed   Hepatitis C Screening  Completed   Zoster (Shingles) Vaccine  Completed   Hepatitis B Vaccine  Aged Out   HPV Vaccine  Aged Out   Meningitis B Vaccine  Aged Out    Advanced directives: (Declined) Advance directive discussed with you today. Even though you declined this today, please call our office should you change your mind, and we can give you the proper paperwork for you to fill out. Advance Care Planning is important because it:  [x]  Makes sure you receive  the medical care that is consistent with your values, goals, and preferences  [x]  It provides guidance to your family and loved ones and reduces their decisional burden about whether or not they are making the right decisions based on your wishes.  Follow the link provided in your after visit summary or read over the paperwork we have mailed to you to help you started getting your Advance Directives in place. If you need assistance in completing these, please reach out to us  so that we can help you!  See attachments for Preventive Care and Fall Prevention Tips.

## 2024-04-10 NOTE — Progress Notes (Signed)
  SUBJECTIVE:   CHIEF COMPLAINT / HPI:   HLD Patient would like cholesterol checked, but has not been taking medicine, has been trying to control with diet.   Urinary Freq Patient continues to have urinary frequency.  Patient has decreased fluid intake during the evenings, drinks little coffee, has done some Kegel exercises, still continues to have urinary frequency.  Patient appreciates sometimes she will have low volumes of urine, at times to just have small drips.  Patient denies any dysuria, fevers or systemic symptoms.  Patient appreciates she is in normal state of health.  Patient appreciates this been going on for over 6 months  PERTINENT  PMH / PSH:   OBJECTIVE:  BP 138/86   Pulse 83   Wt 140 lb 12.8 oz (63.9 kg)   SpO2 100%   BMI 27.50 kg/m  Physical Exam Constitutional:      General: She is not in acute distress.    Appearance: Normal appearance. She is not ill-appearing.  Pulmonary:     Effort: Pulmonary effort is normal.  Abdominal:     General: Abdomen is flat. There is no distension.     Palpations: Abdomen is soft. There is no mass.     Tenderness: There is no abdominal tenderness. There is no right CVA tenderness, left CVA tenderness, guarding or rebound.     Hernia: No hernia is present.  Neurological:     Mental Status: She is alert.  Psychiatric:        Mood and Affect: Mood normal.        Behavior: Behavior normal.      ASSESSMENT/PLAN:   Assessment & Plan Urinary frequency Patient comes in for follow-up of her urinary frequency.  Patient reports having tried decreasing fluid intake, adjusting fluids drink, Kegel exercises, still continues to have urinary frequency.  Patient appreciates sometimes she has full volumes of urine and other times she has dribbles.  Patient denies any dysuria or systemic symptoms.  Patient had normal abdominal exam, no CVA tenderness.  Suspect patient has overactive bladder, given that she was evaluated for UTI, electrolyte  issues previously.  Will obtain UA to rule out UTI, but likely will start mirabegron  and have follow up. - UA, BMP, urine culture - Mirabegron  25 mg daily - Follow-up 1 month Mixed hyperlipidemia Patient comes in for follow-up of her hyperlipidemia.  Patient reports she takes no medicine for her lipidemia, but would like her lipid panel checked. - Lipid panel No follow-ups on file. Penne Rhein, MD 04/10/2024, 4:35 PM PGY-3, [facility]

## 2024-04-10 NOTE — Progress Notes (Signed)
 Because this visit was a virtual/telehealth visit,  certain criteria was not obtained, such a blood pressure, CBG if applicable, and timed get up and go. Any medications not marked as taking were not mentioned during the medication reconciliation part of the visit. Any vitals not documented were not able to be obtained due to this being a telehealth visit or patient was unable to self-report a recent blood pressure reading due to a lack of equipment at home via telehealth. Vitals that have been documented are verbally provided by the patient.   Subjective:   Deborah [name] is a 68 y.o. who presents for a Medicare Wellness preventive visit.  As a reminder, Annual Wellness Visits don't include a physical exam, and some assessments may be limited, especially if this visit is performed virtually. We may recommend an in-person follow-up visit with your provider if needed.  Visit Complete: Virtual I connected with  Lizette Silberstein on 04/10/24 by a audio enabled telemedicine application and verified that I am speaking with the correct person using two identifiers.  Patient Location: Home  Provider Location: Home Office  I discussed the limitations of evaluation and management by telemedicine. The patient expressed understanding and agreed to proceed.  Vital Signs: Because this visit was a virtual/telehealth visit, some criteria may be missing or patient reported. Any vitals not documented were not able to be obtained and vitals that have been documented are patient reported.  VideoDeclined- This patient declined Librarian, academic. Therefore the visit was completed with audio only.  Persons Participating in Visit: Patient.  AWV Questionnaire: No: Patient Medicare AWV questionnaire was not completed prior to this visit.  Cardiac Risk Factors include: advanced age (>46men, >31 women);hypertension;dyslipidemia;family history of premature cardiovascular  disease;sedentary lifestyle     Objective:    Today's Vitals   04/10/24 1113  Weight: 138 lb (62.6 kg)  Height: 5' (1.524 m)  PainSc: 0-No pain   Body mass index is 26.95 kg/m.     04/10/2024   11:15 AM 12/12/2023    8:24 AM 07/03/2023    3:30 PM 04/12/2023    3:40 PM 03/06/2023    9:08 AM 01/04/2023    4:32 PM 08/07/2022    2:53 PM  Advanced Directives  Does Patient Have a Medical Advance Directive? No No No No No No No  Would patient like information on creating a medical advance directive? No - Patient declined No - Patient declined  No - Patient declined No - Patient declined No - Patient declined     Current Medications (verified) Outpatient Encounter Medications as of 04/10/2024  Medication Sig   Ascorbic Acid (VITAMIN C) 1000 MG tablet Take 1,000 mg by mouth daily.   cholecalciferol (VITAMIN D3) 25 MCG (1000 UNIT) tablet Take 1,000 Units by mouth daily.   Cyanocobalamin (VITAMIN B 12 PO) Take by mouth.   fluticasone  (FLONASE ) 50 MCG/ACT nasal spray Place 2 sprays into both nostrils daily.   Ibuprofen (ADVIL PO) Take by mouth. (Patient not taking: Reported on 01/04/2023)   Magnesium 400 MG CAPS Take by mouth.   Multiple Vitamins-Minerals (ZINC PO) Take by mouth.   Omega-3 Fatty Acids (FISH OIL) 300 MG CAPS Take by mouth.   valsartan -hydrochlorothiazide  (DIOVAN -HCT) 320-25 MG tablet TAKE 1 TABLET BY MOUTH DAILY   vitamin E 180 MG (400 UNITS) capsule Take 400 Units by mouth daily.   Zinc Sulfate (ZINC 15 PO) Take by mouth.   No facility-administered encounter medications on file as of 04/10/2024.  Allergies (verified) Pork-derived products   History: Past Medical History:  Diagnosis Date   Arthritis    Hypertension    Past Surgical History:  Procedure Laterality Date   carpel tunnel Right 2005   CESAREAN SECTION  05/14/1993   COLONOSCOPY     ~10 yrs ago- normal per pt- in [place]    Family History  Problem Relation Age of Onset   Ovarian cancer  Mother    Stroke Father    Colon polyps Sister    Breast cancer Paternal Aunt    Breast cancer Paternal Uncle    Stroke Brother        in his early thirties   Colon cancer Neg Hx    Esophageal cancer Neg Hx    Rectal cancer Neg Hx    Stomach cancer Neg Hx    Social History   Socioeconomic History   Marital status: Married    Spouse name: Dorann   Number of children: 2   Years of education: Not on file   Highest education level: Not on file  Occupational History   Occupation: [employer]   Tobacco Use   Smoking status: Never    Passive exposure: Past   Smokeless tobacco: Never  Vaping Use   Vaping status: Never Used  Substance and Sexual Activity   Alcohol use: Never   Drug use: Never   Sexual activity: Yes    Partners: Male    Birth control/protection: Post-menopausal    Comment: Husband   Other Topics Concern   Not on file  Social History Narrative   Patient lives with her husband [place].    Patient has two children.    Patient helps with her granddaughter.    Patient has chronic back pain which limits her exercise.       Social Drivers of Corporate investment banker Strain: Low Risk  (04/10/2024)   Overall Financial Resource Strain (CARDIA)    Difficulty of Paying Living Expenses: Not hard at all  Food Insecurity: No Food Insecurity (04/10/2024)   Hunger Vital Sign    Worried About Running Out of Food in the Last Year: Never true    Ran Out of Food in the Last Year: Never true  Transportation Needs: No Transportation Needs (04/10/2024)   PRAPARE - Administrator, Civil Service (Medical): No    Lack of Transportation (Non-Medical): No  Physical Activity: Inactive (04/10/2024)   Exercise Vital Sign    Days of Exercise per Week: 0 days    Minutes of Exercise per Session: 0 min  Stress: No Stress Concern Present (04/10/2024)   [employer] of Occupational Health - Occupational Stress Questionnaire    Feeling of Stress: Not at all  Social Connections:  Moderately Integrated (04/10/2024)   Social Connection and Isolation Panel    Frequency of Communication with Friends and Family: Three times a week    Frequency of Social Gatherings with Friends and Family: Once a week    Attends Religious Services: More than 4 times per year    Active Member of [employer] or Organizations: No    Attends Engineer, structural: Never    Marital Status: Married    Tobacco Counseling Counseling given: Not Answered    Clinical Intake:  Pre-visit preparation completed: Yes  Pain : No/denies pain Pain Score: 0-No pain     BMI - recorded: 26.95 Nutritional Status: BMI 25 -29 Overweight Nutritional Risks: None Diabetes: No  Lab Results  Component Value Date   HGBA1C 5.6 04/12/2023   HGBA1C 5.8 01/12/2022   HGBA1C 5.7 (A) 08/02/2020     How often do you need to have someone help you when you read instructions, pamphlets, or other written materials from your doctor or pharmacy?: 1 - Never  Interpreter Needed?: No  Information entered by :: Jennette Leask N. Nastassia Bazaldua, LPN.   Activities of Daily Living     04/10/2024   11:16 AM  In your present state of health, do you have any difficulty performing the following activities:  Hearing? 0  Vision? 0  Difficulty concentrating or making decisions? 0  Walking or climbing stairs? 0  Dressing or bathing? 0  Doing errands, shopping? 0  Preparing Food and eating ? N  Using the Toilet? N  In the past six months, have you accidently leaked urine? Y  Do you have problems with loss of bowel control? N  Managing your Medications? N  Managing your Finances? N  Housekeeping or managing your Housekeeping? N    Patient Care Team: Alba Sharper, MD as PCP - General (Family Medicine)  I have updated your Care Teams any recent Medical Services you may have received from other providers in the past year.     Assessment:   This is a routine wellness examination for Sarika.  Hearing/Vision  screen Hearing Screening - Comments:: Adequate hearing, no need for hearing aids. Vision Screening - Comments:: Wears reading glasses - not up to date with routine eye exams.    Goals Addressed             This Visit's Progress    : To control my chronic back pain.         Depression Screen     04/10/2024   11:19 AM 12/12/2023    8:24 AM 07/03/2023    3:29 PM 04/12/2023    3:41 PM 03/06/2023    9:14 AM 01/04/2023    4:31 PM 08/07/2022    2:54 PM  PHQ 2/9 Scores  PHQ - 2 Score 0 0 0 0 0 0 0  PHQ- 9 Score 0 0 0 0 0 0 0    Fall Risk     04/10/2024   11:15 AM 12/12/2023    8:24 AM 07/03/2023    3:29 PM 03/06/2023    9:08 AM 01/12/2022    3:54 PM  Fall Risk   Falls in the past year? 0 0 0 0 0  Number falls in past yr: 0 0 0 0 0  Injury with Fall? 0 0 0 0 0  Risk for fall due to : No Fall Risks      Follow up Falls evaluation completed   Falls evaluation completed;Education provided;Falls prevention discussed     MEDICARE RISK AT HOME:  Medicare Risk at Home Any stairs in or around the home?: Yes If so, are there any without handrails?: No Home free of loose throw rugs in walkways, pet beds, electrical cords, etc?: Yes Adequate lighting in your home to reduce risk of falls?: Yes Life alert?: No Use of a cane, walker or w/c?: No Grab bars in the bathroom?: Yes Shower chair or bench in shower?: No Elevated toilet seat or a handicapped toilet?: No  TIMED UP AND GO:  Was the test performed?  No  Cognitive Function: Declined/Normal: No cognitive concerns noted by patient or family. Patient alert, oriented, able to answer questions appropriately and recall recent events. No signs of memory loss or confusion.  04/10/2024   11:19 AM  MMSE - Mini Mental State Exam  Not completed: Unable to complete        04/10/2024   11:14 AM 03/06/2023    9:11 AM 11/24/2021    4:06 PM  6CIT Screen  What Year? 0 points 0 points 0 points  What month? 0 points 0 points 0 points  What time?  0 points 0 points 0 points  Count back from 20 0 points 0 points 0 points  Months in reverse 0 points 0 points 0 points  Repeat phrase 0 points 2 points 0 points  Total Score 0 points 2 points 0 points    Immunizations Immunization History  Administered Date(s) Administered   Influenza,inj,Quad PF,6+ Mos 07/27/2017, 08/27/2019, 08/02/2020   Influenza-Unspecified 08/03/2021, 07/23/2022, 06/21/2023   Moderna SARS-COV2 Booster Vaccination 08/13/2020   Moderna Sars-Covid-2 Vaccination 12/13/2019, 01/13/2020   PNEUMOCOCCAL CONJUGATE-20 12/11/2022   Pfizer(Comirnaty)Fall Seasonal Vaccine 12 years and older 07/03/2023   Tdap 05/06/2022   Zoster Recombinant(Shingrix) 05/06/2022, 07/23/2022    Screening Tests Health Maintenance  Topic Date Due   COVID-19 Vaccine (5 - 2024-25 season) 01/01/2024   INFLUENZA VACCINE  05/02/2024   Medicare Annual Wellness (AWV)  04/10/2025   MAMMOGRAM  03/14/2026   Colonoscopy  03/19/2028   DTaP/Tdap/Td (2 - Td or Tdap) 05/06/2032   Pneumococcal Vaccine: 50+ Years  Completed   DEXA SCAN  Completed   Hepatitis C Screening  Completed   Zoster Vaccines- Shingrix  Completed   Hepatitis B Vaccines  Aged Out   HPV VACCINES  Aged Out   Meningococcal B Vaccine  Aged Out    Health Maintenance  Health Maintenance Due  Topic Date Due   COVID-19 Vaccine (5 - 2024-25 season) 01/01/2024   Health Maintenance Items Addressed: Yes Patient aware of current care gaps.  Immunization record was verified by [facility] and updated in patient's chart. Patient is due for Covid-19 vaccine.  Additional Screening:  Vision Screening: Recommended annual ophthalmology exams for early detection of glaucoma and other disorders of the eye. Would you like a referral to an eye doctor? No    Dental Screening: Recommended annual dental exams for proper oral hygiene  Community Resource Referral / Chronic Care Management: CRR required this visit?  No   CCM required this visit?   No   Plan:    I have personally reviewed and noted the following in the patient's chart:   Medical and social history Use of alcohol, tobacco or illicit drugs  Current medications and supplements including opioid prescriptions. Patient is not currently taking opioid prescriptions. Functional ability and status Nutritional status Physical activity Advanced directives List of other physicians Hospitalizations, surgeries, and ER visits in previous 12 months Vitals Screenings to include cognitive, depression, and falls Referrals and appointments  In addition, I have reviewed and discussed with patient certain preventive protocols, quality metrics, and best practice recommendations. A written personalized care plan for preventive services as well as general preventive health recommendations were provided to patient.   Roz LOISE Fuller, LPN   [id number]/[id number]   After Visit Summary: (MyChart) Due to this being a telephonic visit, the after visit summary with patients personalized plan was offered to patient via MyChart   Notes: Patient aware of current care gaps.  Immunization record was verified by [facility] and updated in patient's chart.  Patient is due for Covid vaccine, an office visit and lab work.

## 2024-04-10 NOTE — Assessment & Plan Note (Addendum)
 Patient comes in for follow-up of her hyperlipidemia.  Patient reports she takes no medicine for her lipidemia, but would like her lipid panel checked. - Lipid panel

## 2024-04-11 LAB — BASIC METABOLIC PANEL WITH GFR
BUN/Creatinine Ratio: 19 (ref 12–28)
BUN: 15 mg/dL (ref 8–27)
CO2: 24 mmol/L (ref 20–29)
Calcium: 9.7 mg/dL (ref 8.7–10.3)
Chloride: 97 mmol/L (ref 96–106)
Creatinine, Ser: 0.78 mg/dL (ref 0.57–1.00)
Glucose: 90 mg/dL (ref 70–99)
Potassium: 3.7 mmol/L (ref 3.5–5.2)
Sodium: 137 mmol/L (ref 134–144)
eGFR: 83 mL/min/1.73 (ref >59)

## 2024-04-11 LAB — LIPID PANEL
Chol/HDL Ratio: 7.1 ratio — ABNORMAL HIGH (ref 0.0–4.4)
Cholesterol, Total: 256 mg/dL — ABNORMAL HIGH (ref 100–199)
HDL: 36 mg/dL — ABNORMAL LOW (ref >39)
LDL Chol Calc (NIH): 139 mg/dL — ABNORMAL HIGH (ref 0–99)
Triglycerides: 437 mg/dL — ABNORMAL HIGH (ref 0–149)
VLDL Cholesterol Cal: 81 mg/dL — ABNORMAL HIGH (ref 5–40)

## 2024-04-21 ENCOUNTER — Ambulatory Visit: Payer: Self-pay | Admitting: Student

## 2024-04-21 ENCOUNTER — Other Ambulatory Visit: Payer: Self-pay | Admitting: Student

## 2024-04-21 MED ORDER — ROSUVASTATIN CALCIUM 20 MG PO TABS: 20.0000 mg | ORAL_TABLET | Freq: Every day | ORAL | 3 refills | Status: AC

## 2024-04-21 NOTE — Progress Notes (Signed)
 Starting crestor  for elevated lipid pannel

## 2024-06-21 ENCOUNTER — Other Ambulatory Visit: Payer: Self-pay | Admitting: Family Medicine

## 2024-06-21 DIAGNOSIS — I10 Essential (primary) hypertension: Secondary | ICD-10-CM

## 2024-09-26 ENCOUNTER — Other Ambulatory Visit: Payer: Self-pay | Admitting: Family Medicine

## 2024-09-26 DIAGNOSIS — I10 Essential (primary) hypertension: Secondary | ICD-10-CM

## 2024-09-29 NOTE — Telephone Encounter (Signed)
 Patient calls nurse line requesting refill on valsartan -hydrochlorothiazide . She reports that she is currently on vacation in [place]  and needs new prescription sent to [facility].   Medication pended to request.   Chiquita JAYSON English, RN

## 2025-04-13 ENCOUNTER — Encounter
